# Patient Record
Sex: Female | Born: 1940 | Race: White | Hispanic: No | Marital: Married | Smoking: Never smoker
Health system: Southern US, Community
[De-identification: ages and names within clinical notes are randomized; demographics above are authoritative.]

## PROBLEM LIST (undated history)

## (undated) DIAGNOSIS — B029 Zoster without complications: Secondary | ICD-10-CM

## (undated) DIAGNOSIS — N819 Female genital prolapse, unspecified: Secondary | ICD-10-CM

## (undated) DIAGNOSIS — B2799 Infectious mononucleosis, unspecified with other complication: Secondary | ICD-10-CM

## (undated) DIAGNOSIS — K219 Gastro-esophageal reflux disease without esophagitis: Secondary | ICD-10-CM

## (undated) DIAGNOSIS — E559 Vitamin D deficiency, unspecified: Secondary | ICD-10-CM

## (undated) DIAGNOSIS — R32 Unspecified urinary incontinence: Secondary | ICD-10-CM

## (undated) DIAGNOSIS — K623 Rectal prolapse: Secondary | ICD-10-CM

## (undated) DIAGNOSIS — A809 Acute poliomyelitis, unspecified: Secondary | ICD-10-CM

## (undated) DIAGNOSIS — E039 Hypothyroidism, unspecified: Secondary | ICD-10-CM

## (undated) DIAGNOSIS — B178 Other specified acute viral hepatitis: Secondary | ICD-10-CM

## (undated) DIAGNOSIS — M40209 Unspecified kyphosis, site unspecified: Secondary | ICD-10-CM

## (undated) HISTORY — PX: ABDOMINAL HYSTERECTOMY: SHX81

## (undated) HISTORY — PX: BACK SURGERY: SHX140

---

## 2001-06-25 ENCOUNTER — Encounter: Payer: Self-pay | Admitting: Endocrinology

## 2001-06-25 ENCOUNTER — Encounter: Admission: RE | Admit: 2001-06-25 | Discharge: 2001-06-25 | Payer: Self-pay | Admitting: Endocrinology

## 2001-08-24 ENCOUNTER — Encounter: Payer: Self-pay | Admitting: Endocrinology

## 2001-08-24 ENCOUNTER — Encounter: Admission: RE | Admit: 2001-08-24 | Discharge: 2001-08-24 | Payer: Self-pay | Admitting: Endocrinology

## 2001-11-26 ENCOUNTER — Encounter: Admission: RE | Admit: 2001-11-26 | Discharge: 2001-11-26 | Payer: Self-pay | Admitting: Endocrinology

## 2001-11-26 ENCOUNTER — Encounter: Payer: Self-pay | Admitting: Endocrinology

## 2006-10-31 ENCOUNTER — Ambulatory Visit (HOSPITAL_COMMUNITY): Admission: RE | Admit: 2006-10-31 | Discharge: 2006-10-31 | Payer: Self-pay | Admitting: Neurosurgery

## 2016-02-02 ENCOUNTER — Emergency Department (HOSPITAL_BASED_OUTPATIENT_CLINIC_OR_DEPARTMENT_OTHER)
Admission: EM | Admit: 2016-02-02 | Discharge: 2016-02-02 | Disposition: A | Discharge status: Home / Self Care | Payer: Medicare Other | Attending: Emergency Medicine | Admitting: Emergency Medicine

## 2016-02-02 ENCOUNTER — Encounter (HOSPITAL_BASED_OUTPATIENT_CLINIC_OR_DEPARTMENT_OTHER): Payer: Self-pay

## 2016-02-02 DIAGNOSIS — K625 Hemorrhage of anus and rectum: Secondary | ICD-10-CM | POA: Diagnosis present

## 2016-02-02 DIAGNOSIS — Z79899 Other long term (current) drug therapy: Secondary | ICD-10-CM | POA: Insufficient documentation

## 2016-02-02 DIAGNOSIS — K649 Unspecified hemorrhoids: Secondary | ICD-10-CM | POA: Insufficient documentation

## 2016-02-02 DIAGNOSIS — E039 Hypothyroidism, unspecified: Secondary | ICD-10-CM | POA: Insufficient documentation

## 2016-02-02 DIAGNOSIS — K623 Rectal prolapse: Secondary | ICD-10-CM

## 2016-02-02 HISTORY — DX: Infectious mononucleosis, unspecified with other complication: B27.99

## 2016-02-02 HISTORY — DX: Rectal prolapse: K62.3

## 2016-02-02 HISTORY — DX: Other specified acute viral hepatitis: B17.8

## 2016-02-02 HISTORY — DX: Zoster without complications: B02.9

## 2016-02-02 HISTORY — DX: Unspecified kyphosis, site unspecified: M40.209

## 2016-02-02 HISTORY — DX: Gastro-esophageal reflux disease without esophagitis: K21.9

## 2016-02-02 HISTORY — DX: Hypothyroidism, unspecified: E03.9

## 2016-02-02 HISTORY — DX: Vitamin D deficiency, unspecified: E55.9

## 2016-02-02 HISTORY — DX: Unspecified urinary incontinence: R32

## 2016-02-02 HISTORY — DX: Female genital prolapse, unspecified: N81.9

## 2016-02-02 HISTORY — DX: Acute poliomyelitis, unspecified: A80.9

## 2016-02-02 MED ORDER — OXYCODONE HCL 5 MG PO TABS
15.0000 mg | ORAL_TABLET | Freq: Once | ORAL | Status: AC
  Administered 2016-02-02: 15 mg via ORAL
  Filled 2016-02-02: qty 3

## 2016-02-02 NOTE — Discharge Instructions (Signed)
Call a surgeon if needed.

## 2016-02-02 NOTE — ED Provider Notes (Signed)
[facility]   CSN: [id number] Arrival date & time: 02/02/16  1256     History   Chief Complaint Chief Complaint  Patient presents with  . Rectal Bleeding    HPI Wendy Espinoza is a 76 y.o. female.  76 yo F with a chief complaint of rectal prolapse. This is been an ongoing issue for this patient. She has never had trouble reducing it at home. Was unable to reduce it yesterday. Had some muco-bloody discharge. Over the course of the day she soaks through one pad. Denies near syncope or presyncopal feeling. Denies weakness. Complaining of chronic low back pain. Not changing in character or severity. Denies any other complaints at this time. Her prolapse reduced spontaneously this morning.   The history is provided by the patient and the spouse.  Rectal Bleeding  Quality:  Bright red and mucoid Amount:  Scant Duration:  1 day Timing:  Constant Chronicity:  New Context: hemorrhoids   Context comment:  Rectal prolapse Similar prior episodes: no   Relieved by:  Nothing Worsened by:  Nothing Ineffective treatments:  None tried Associated symptoms: no dizziness, no fever and no vomiting     Past Medical History:  Diagnosis Date  . Genital prolapse   . GERD (gastroesophageal reflux disease)   . Hypothyroid   . Kyphosis   . Mononucleosis, infectious, with hepatitis   . Polio   . Rectal prolapse   . Shingles   . Urinary incontinence   . Vitamin D deficiency     There are no active problems to display for this patient.   Past Surgical History:  Procedure Laterality Date  . ABDOMINAL HYSTERECTOMY    . BACK SURGERY      OB History    No data available       Home Medications    Prior to Admission medications   Medication Sig Start Date End Date Taking? Authorizing Provider  ALBUTEROL IN Inhale into the lungs.   Yes Historical Provider, MD  Carisoprodol (SOMA PO) Take by mouth.   Yes Historical Provider, MD  Cetirizine HCl (ZYRTEC PO)  Take by mouth.   Yes Historical Provider, MD  ClonazePAM (KLONOPIN PO) Take by mouth.   Yes Historical Provider, MD  Diphenoxylate-Atropine (LOMOTIL PO) Take by mouth.   Yes Historical Provider, MD  DULoxetine HCl (CYMBALTA PO) Take by mouth.   Yes Historical Provider, MD  Levothyroxine Sodium (SYNTHROID PO) Take by mouth.   Yes Historical Provider, MD  Modafinil (PROVIGIL PO) Take by mouth.   Yes Historical Provider, MD  Olopatadine HCl (PATADAY OP) Apply to eye.   Yes Historical Provider, MD  Omeprazole (PRILOSEC PO) Take by mouth.   Yes Historical Provider, MD  oxyCODONE (ROXICODONE) 15 MG immediate release tablet Take 15 mg by mouth every 4 (four) hours as needed for pain.   Yes Historical Provider, MD  Polyethyl Glycol-Propyl Glycol (SYSTANE OP) Apply to eye.   Yes Historical Provider, MD    Family History No family history on file.  Social History Social History  Substance Use Topics  . Smoking status: Never Smoker  . Smokeless tobacco: Never Used  . Alcohol use Not on file     Allergies   Gabapentin; Gluten meal; Lactase; Lyrica [pregabalin]; Nsaids; Soy allergy; Tagamet [cimetidine]; Tizanidine; Topamax [topiramate]; and Wheat bran   Review of Systems Review of Systems  Constitutional: Negative for chills and fever.  HENT: Negative for congestion and rhinorrhea.   Eyes: Negative for  redness and visual disturbance.  Respiratory: Negative for shortness of breath and wheezing.   Cardiovascular: Negative for chest pain and palpitations.  Gastrointestinal: Positive for blood in stool, hematochezia and rectal pain. Negative for nausea and vomiting.  Genitourinary: Negative for dysuria and urgency.  Musculoskeletal: Negative for arthralgias and myalgias.  Skin: Negative for pallor and wound.  Neurological: Negative for dizziness and headaches.     Physical Exam Updated Vital Signs BP 145/74 (BP Location: Right Arm)   Pulse 85   Temp 98.3 F (36.8 C) (Oral)   Resp 16    Ht 5\' 3"  (1.6 m)   Wt 115 lb (52.2 kg)   SpO2 100%   BMI 20.37 kg/m   Physical Exam  Constitutional: She is oriented to person, place, and time. She appears cachectic. No distress.  HENT:  Head: Normocephalic and atraumatic.  Eyes: EOM are normal. Pupils are equal, round, and reactive to light.  Neck: Normal range of motion. Neck supple.  Cardiovascular: Normal rate and regular rhythm.  Exam reveals no gallop and no friction rub.   No murmur heard. Pulmonary/Chest: Effort normal. She has no wheezes. She has no rales.  Abdominal: Soft. She exhibits no distension. There is no tenderness.  Genitourinary:  Genitourinary Comments: Hemorrhoids noted noted rectal prolapse  Musculoskeletal: She exhibits no edema or tenderness.  Neurological: She is alert and oriented to person, place, and time.  Skin: Skin is warm and dry. She is not diaphoretic.  Psychiatric: She has a normal mood and affect. Her behavior is normal.  Nursing note and vitals reviewed.    ED Treatments / Results  Labs (all labs ordered are listed, but only abnormal results are displayed) Labs Reviewed - No data to display  EKG  EKG Interpretation None       Radiology No results found.  Procedures Procedures (including critical care time)  Medications Ordered in ED Medications  oxyCODONE (Oxy IR/ROXICODONE) immediate release tablet 15 mg (not administered)     Initial Impression / Assessment and Plan / ED Course  I have reviewed the triage vital signs and the nursing notes.  Pertinent labs & imaging results that were available during my care of the patient were reviewed by me and considered in my medical decision making (see chart for details).  Clinical Course     76 yo F With a chief complaint of rectal prolapse. Family does not feel that she is anemic. For labs at this time. This reduces spontaneously prior to my evaluation. Given surgery follow-up. Discharge home.  3:19 PM:  I have discussed  the diagnosis/risks/treatment options with the patient and family and believe the pt to be eligible for discharge home to follow-up with PCP, Gen surgery. We also discussed returning to the ED immediately if new or worsening sx occur. We discussed the sx which are most concerning (e.g., sudden worsening pain, fever, inability to tolerate by mouth) that necessitate immediate return. Medications administered to the patient during their visit and any new prescriptions provided to the patient are listed below.  Medications given during this visit Medications  oxyCODONE (Oxy IR/ROXICODONE) immediate release tablet 15 mg (not administered)     The patient appears reasonably screen and/or stabilized for discharge and I doubt any other medical condition or other [facility] requiring further screening, evaluation, or treatment in the ED at this time prior to discharge.    Final Clinical Impressions(s) / ED Diagnoses   Final diagnoses:  Rectal prolapse  Rectal bleeding  Hemorrhoids, unspecified  hemorrhoid type    New Prescriptions New Prescriptions   No medications on file     Melene Plan, DO 02/02/16 1519

## 2016-02-02 NOTE — ED Triage Notes (Signed)
C/o "bloody discharge" from prolapsed rectum x 2-3 day-presents to triage in w/c with family-NAD

## 2016-02-11 ENCOUNTER — Emergency Department (HOSPITAL_COMMUNITY): Payer: Medicare Other | Admitting: Anesthesiology

## 2016-02-11 ENCOUNTER — Encounter (HOSPITAL_COMMUNITY): Payer: Self-pay

## 2016-02-11 ENCOUNTER — Encounter (HOSPITAL_COMMUNITY): Admission: EM | Disposition: A | Discharge status: Home / Self Care | Payer: Self-pay | Source: Home / Self Care

## 2016-02-11 ENCOUNTER — Inpatient Hospital Stay (HOSPITAL_COMMUNITY)
Admission: EM | Admit: 2016-02-11 | Discharge: 2016-02-23 | DRG: 329 | Disposition: A | Discharge status: Home / Self Care | Payer: Medicare Other | Attending: Family Medicine | Admitting: Family Medicine

## 2016-02-11 DIAGNOSIS — Z90710 Acquired absence of both cervix and uterus: Secondary | ICD-10-CM | POA: Diagnosis not present

## 2016-02-11 DIAGNOSIS — Z888 Allergy status to other drugs, medicaments and biological substances status: Secondary | ICD-10-CM

## 2016-02-11 DIAGNOSIS — Y92009 Unspecified place in unspecified non-institutional (private) residence as the place of occurrence of the external cause: Secondary | ICD-10-CM | POA: Diagnosis not present

## 2016-02-11 DIAGNOSIS — E43 Unspecified severe protein-calorie malnutrition: Secondary | ICD-10-CM | POA: Diagnosis present

## 2016-02-11 DIAGNOSIS — B370 Candidal stomatitis: Secondary | ICD-10-CM | POA: Diagnosis present

## 2016-02-11 DIAGNOSIS — K625 Hemorrhage of anus and rectum: Secondary | ICD-10-CM | POA: Diagnosis present

## 2016-02-11 DIAGNOSIS — Z886 Allergy status to analgesic agent status: Secondary | ICD-10-CM

## 2016-02-11 DIAGNOSIS — Z91018 Allergy to other foods: Secondary | ICD-10-CM

## 2016-02-11 DIAGNOSIS — J189 Pneumonia, unspecified organism: Secondary | ICD-10-CM | POA: Diagnosis not present

## 2016-02-11 DIAGNOSIS — X500XXA Overexertion from strenuous movement or load, initial encounter: Secondary | ICD-10-CM

## 2016-02-11 DIAGNOSIS — Z8612 Personal history of poliomyelitis: Secondary | ICD-10-CM

## 2016-02-11 DIAGNOSIS — E871 Hypo-osmolality and hyponatremia: Secondary | ICD-10-CM | POA: Diagnosis not present

## 2016-02-11 DIAGNOSIS — Z79891 Long term (current) use of opiate analgesic: Secondary | ICD-10-CM | POA: Diagnosis not present

## 2016-02-11 DIAGNOSIS — G894 Chronic pain syndrome: Secondary | ICD-10-CM | POA: Diagnosis present

## 2016-02-11 DIAGNOSIS — E039 Hypothyroidism, unspecified: Secondary | ICD-10-CM | POA: Diagnosis present

## 2016-02-11 DIAGNOSIS — Z9889 Other specified postprocedural states: Secondary | ICD-10-CM

## 2016-02-11 DIAGNOSIS — G2581 Restless legs syndrome: Secondary | ICD-10-CM | POA: Diagnosis present

## 2016-02-11 DIAGNOSIS — Z6820 Body mass index (BMI) 20.0-20.9, adult: Secondary | ICD-10-CM | POA: Diagnosis not present

## 2016-02-11 DIAGNOSIS — Q438 Other specified congenital malformations of intestine: Secondary | ICD-10-CM

## 2016-02-11 DIAGNOSIS — R0989 Other specified symptoms and signs involving the circulatory and respiratory systems: Secondary | ICD-10-CM

## 2016-02-11 DIAGNOSIS — K567 Ileus, unspecified: Secondary | ICD-10-CM | POA: Diagnosis not present

## 2016-02-11 DIAGNOSIS — M40209 Unspecified kyphosis, site unspecified: Secondary | ICD-10-CM | POA: Diagnosis present

## 2016-02-11 DIAGNOSIS — E876 Hypokalemia: Secondary | ICD-10-CM | POA: Diagnosis present

## 2016-02-11 DIAGNOSIS — Y95 Nosocomial condition: Secondary | ICD-10-CM | POA: Diagnosis present

## 2016-02-11 DIAGNOSIS — Y93F2 Activity, caregiving, lifting: Secondary | ICD-10-CM | POA: Diagnosis not present

## 2016-02-11 DIAGNOSIS — I10 Essential (primary) hypertension: Secondary | ICD-10-CM | POA: Diagnosis present

## 2016-02-11 DIAGNOSIS — S3663XA Laceration of rectum, initial encounter: Secondary | ICD-10-CM | POA: Diagnosis present

## 2016-02-11 DIAGNOSIS — R32 Unspecified urinary incontinence: Secondary | ICD-10-CM | POA: Diagnosis present

## 2016-02-11 DIAGNOSIS — IMO0002 Reserved for concepts with insufficient information to code with codable children: Secondary | ICD-10-CM | POA: Diagnosis present

## 2016-02-11 DIAGNOSIS — Z79899 Other long term (current) drug therapy: Secondary | ICD-10-CM

## 2016-02-11 DIAGNOSIS — K219 Gastro-esophageal reflux disease without esophagitis: Secondary | ICD-10-CM | POA: Diagnosis present

## 2016-02-11 DIAGNOSIS — D62 Acute posthemorrhagic anemia: Secondary | ICD-10-CM | POA: Diagnosis not present

## 2016-02-11 DIAGNOSIS — R262 Difficulty in walking, not elsewhere classified: Secondary | ICD-10-CM

## 2016-02-11 DIAGNOSIS — K439 Ventral hernia without obstruction or gangrene: Secondary | ICD-10-CM | POA: Diagnosis not present

## 2016-02-11 DIAGNOSIS — D638 Anemia in other chronic diseases classified elsewhere: Secondary | ICD-10-CM | POA: Diagnosis present

## 2016-02-11 DIAGNOSIS — R509 Fever, unspecified: Secondary | ICD-10-CM

## 2016-02-11 HISTORY — PX: COLOSTOMY: SHX63

## 2016-02-11 HISTORY — PX: COLON RESECTION SIGMOID: SHX6737

## 2016-02-11 HISTORY — PX: LAPAROTOMY: SHX154

## 2016-02-11 LAB — POCT I-STAT EG7
BICARBONATE: 26.2 mmol/L (ref 20.0–28.0)
Calcium, Ion: 1.19 mmol/L (ref 1.15–1.40)
HEMATOCRIT: 26 % — AB (ref 36.0–46.0)
Hemoglobin: 8.8 g/dL — ABNORMAL LOW (ref 12.0–15.0)
O2 SAT: 73 %
PCO2 VEN: 47.6 mmHg (ref 44.0–60.0)
PO2 VEN: 41 mmHg (ref 32.0–45.0)
Potassium: 3.6 mmol/L (ref 3.5–5.1)
Sodium: 139 mmol/L (ref 135–145)
TCO2: 28 mmol/L (ref 0–100)
pH, Ven: 7.349 (ref 7.250–7.430)

## 2016-02-11 LAB — URINALYSIS, ROUTINE W REFLEX MICROSCOPIC
Bilirubin Urine: NEGATIVE
GLUCOSE, UA: 50 mg/dL — AB
KETONES UR: NEGATIVE mg/dL
Leukocytes, UA: NEGATIVE
NITRITE: NEGATIVE
PROTEIN: 100 mg/dL — AB
Specific Gravity, Urine: 1.025 (ref 1.005–1.030)
pH: 5 (ref 5.0–8.0)

## 2016-02-11 LAB — CBC WITH DIFFERENTIAL/PLATELET
BASOS ABS: 0.1 10*3/uL (ref 0.0–0.1)
Basophils Relative: 1 %
EOS ABS: 0.1 10*3/uL (ref 0.0–0.7)
Eosinophils Relative: 1 %
HCT: 24.1 % — ABNORMAL LOW (ref 36.0–46.0)
HEMOGLOBIN: 6.7 g/dL — AB (ref 12.0–15.0)
LYMPHS ABS: 1.2 10*3/uL (ref 0.7–4.0)
Lymphocytes Relative: 21 %
MCH: 17.3 pg — AB (ref 26.0–34.0)
MCHC: 27.8 g/dL — ABNORMAL LOW (ref 30.0–36.0)
MCV: 62.1 fL — AB (ref 78.0–100.0)
Monocytes Absolute: 0.4 10*3/uL (ref 0.1–1.0)
Monocytes Relative: 7 %
NEUTROS ABS: 3.7 10*3/uL (ref 1.7–7.7)
Neutrophils Relative %: 70 %
PLATELETS: 439 10*3/uL — AB (ref 150–400)
RBC: 3.88 MIL/uL (ref 3.87–5.11)
RDW: 20.3 % — ABNORMAL HIGH (ref 11.5–15.5)
WBC: 5.5 10*3/uL (ref 4.0–10.5)

## 2016-02-11 LAB — BASIC METABOLIC PANEL
Anion gap: 10 (ref 5–15)
BUN: 28 mg/dL — ABNORMAL HIGH (ref 6–20)
CHLORIDE: 103 mmol/L (ref 101–111)
CO2: 25 mmol/L (ref 22–32)
CREATININE: 0.54 mg/dL (ref 0.44–1.00)
Calcium: 8.8 mg/dL — ABNORMAL LOW (ref 8.9–10.3)
GFR calc Af Amer: >60 mL/min (ref >60)
GFR calc non Af Amer: >60 mL/min (ref >60)
GLUCOSE: 153 mg/dL — AB (ref 65–99)
Potassium: 3.5 mmol/L (ref 3.5–5.1)
SODIUM: 138 mmol/L (ref 135–145)

## 2016-02-11 LAB — BLOOD PRODUCT ORDER (VERBAL) VERIFICATION

## 2016-02-11 LAB — PROTIME-INR
INR: 0.99
PROTHROMBIN TIME: 13.1 s (ref 11.4–15.2)

## 2016-02-11 LAB — PREPARE RBC (CROSSMATCH)

## 2016-02-11 LAB — ABO/RH: ABO/RH(D): O POS

## 2016-02-11 SURGERY — CREATION, COLOSTOMY
Anesthesia: General | Site: Abdomen

## 2016-02-11 MED ORDER — HYDROMORPHONE HCL 2 MG/ML IJ SOLN
1.0000 mg | Freq: Once | INTRAMUSCULAR | Status: AC
  Administered 2016-02-11: 1 mg via INTRAVENOUS

## 2016-02-11 MED ORDER — SODIUM CHLORIDE 0.9 % IJ SOLN
INTRAMUSCULAR | Status: AC
  Filled 2016-02-11: qty 10

## 2016-02-11 MED ORDER — ONDANSETRON HCL 4 MG/2ML IJ SOLN
4.0000 mg | Freq: Once | INTRAMUSCULAR | Status: AC
  Administered 2016-02-11: 4 mg via INTRAVENOUS
  Filled 2016-02-11: qty 2

## 2016-02-11 MED ORDER — KETAMINE HCL-SODIUM CHLORIDE 100-0.9 MG/10ML-% IV SOSY
PREFILLED_SYRINGE | INTRAVENOUS | Status: AC
  Filled 2016-02-11: qty 10

## 2016-02-11 MED ORDER — FENTANYL 50 MCG/HR TD PT72
50.0000 ug | MEDICATED_PATCH | TRANSDERMAL | Status: DC
  Administered 2016-02-11 – 2016-02-23 (×5): 50 ug via TRANSDERMAL
  Filled 2016-02-11 (×5): qty 1

## 2016-02-11 MED ORDER — SODIUM CHLORIDE 0.9 % IV BOLUS (SEPSIS)
1000.0000 mL | Freq: Once | INTRAVENOUS | Status: AC
  Administered 2016-02-11: 1000 mL via INTRAVENOUS

## 2016-02-11 MED ORDER — ROCURONIUM BROMIDE 50 MG/5ML IV SOSY
PREFILLED_SYRINGE | INTRAVENOUS | Status: AC
  Filled 2016-02-11: qty 5

## 2016-02-11 MED ORDER — ROCURONIUM BROMIDE 100 MG/10ML IV SOLN
INTRAVENOUS | Status: DC | PRN
  Administered 2016-02-11: 40 mg via INTRAVENOUS

## 2016-02-11 MED ORDER — DEXAMETHASONE SODIUM PHOSPHATE 10 MG/ML IJ SOLN
INTRAMUSCULAR | Status: DC | PRN
  Administered 2016-02-11: 10 mg via INTRAVENOUS

## 2016-02-11 MED ORDER — SUFENTANIL CITRATE 50 MCG/ML IV SOLN
INTRAVENOUS | Status: DC | PRN
  Administered 2016-02-11 (×8): 5 ug via INTRAVENOUS

## 2016-02-11 MED ORDER — DEXTROSE 5 % IV SOLN
1.0000 g | Freq: Once | INTRAVENOUS | Status: AC
  Administered 2016-02-11: 1 g via INTRAVENOUS
  Filled 2016-02-11: qty 1

## 2016-02-11 MED ORDER — SUCCINYLCHOLINE CHLORIDE 20 MG/ML IJ SOLN
INTRAMUSCULAR | Status: DC | PRN
  Administered 2016-02-11: 100 mg via INTRAVENOUS

## 2016-02-11 MED ORDER — LIDOCAINE 2% (20 MG/ML) 5 ML SYRINGE
INTRAMUSCULAR | Status: AC
  Filled 2016-02-11: qty 5

## 2016-02-11 MED ORDER — METHOCARBAMOL 1000 MG/10ML IJ SOLN
500.0000 mg | Freq: Four times a day (QID) | INTRAVENOUS | Status: DC | PRN
  Administered 2016-02-11 (×2): 500 mg via INTRAVENOUS
  Filled 2016-02-11 (×4): qty 5

## 2016-02-11 MED ORDER — SUGAMMADEX SODIUM 200 MG/2ML IV SOLN
INTRAVENOUS | Status: DC | PRN
  Administered 2016-02-11: 200 mg via INTRAVENOUS

## 2016-02-11 MED ORDER — MORPHINE SULFATE (PF) 2 MG/ML IV SOLN
2.0000 mg | INTRAVENOUS | Status: DC | PRN
  Administered 2016-02-11: 2 mg via INTRAVENOUS
  Administered 2016-02-11 (×5): 4 mg via INTRAVENOUS
  Administered 2016-02-11 – 2016-02-12 (×5): 2 mg via INTRAVENOUS
  Filled 2016-02-11 (×2): qty 1
  Filled 2016-02-11: qty 2
  Filled 2016-02-11: qty 1
  Filled 2016-02-11: qty 2
  Filled 2016-02-11: qty 1
  Filled 2016-02-11 (×3): qty 2
  Filled 2016-02-11 (×2): qty 1

## 2016-02-11 MED ORDER — ENOXAPARIN SODIUM 40 MG/0.4ML ~~LOC~~ SOLN
40.0000 mg | SUBCUTANEOUS | Status: DC
  Administered 2016-02-11 – 2016-02-22 (×12): 40 mg via SUBCUTANEOUS
  Filled 2016-02-11 (×11): qty 0.4

## 2016-02-11 MED ORDER — LABETALOL HCL 5 MG/ML IV SOLN
INTRAVENOUS | Status: AC
  Filled 2016-02-11: qty 4

## 2016-02-11 MED ORDER — DEXTROSE 5 % IV SOLN
1.0000 g | Freq: Once | INTRAVENOUS | Status: DC
  Filled 2016-02-11 (×2): qty 1

## 2016-02-11 MED ORDER — MORPHINE SULFATE (PF) 2 MG/ML IV SOLN
2.0000 mg | INTRAVENOUS | Status: DC | PRN
  Administered 2016-02-11: 4 mg via INTRAVENOUS
  Filled 2016-02-11: qty 2

## 2016-02-11 MED ORDER — MEPERIDINE HCL 25 MG/ML IJ SOLN: 6.2500 mg | INTRAMUSCULAR | Status: DC | PRN

## 2016-02-11 MED ORDER — HYDROMORPHONE HCL 2 MG/ML IJ SOLN
INTRAMUSCULAR | Status: AC
  Administered 2016-02-11: 1 mg
  Filled 2016-02-11: qty 1

## 2016-02-11 MED ORDER — SUFENTANIL CITRATE 50 MCG/ML IV SOLN
INTRAVENOUS | Status: AC
  Filled 2016-02-11: qty 1

## 2016-02-11 MED ORDER — ONDANSETRON HCL 4 MG/2ML IJ SOLN
INTRAMUSCULAR | Status: DC | PRN
  Administered 2016-02-11 (×2): 4 mg via INTRAVENOUS

## 2016-02-11 MED ORDER — LIDOCAINE HCL (CARDIAC) 20 MG/ML IV SOLN
INTRAVENOUS | Status: DC | PRN
  Administered 2016-02-11: 100 mg via INTRATRACHEAL

## 2016-02-11 MED ORDER — LACTATED RINGERS IV SOLN
INTRAVENOUS | Status: DC | PRN
  Administered 2016-02-11 (×2): via INTRAVENOUS

## 2016-02-11 MED ORDER — LABETALOL HCL 5 MG/ML IV SOLN
5.0000 mg | INTRAVENOUS | Status: DC | PRN
Start: 2016-02-11 — End: 2016-02-11
  Administered 2016-02-11: 5 mg via INTRAVENOUS

## 2016-02-11 MED ORDER — PROPOFOL 10 MG/ML IV BOLUS
INTRAVENOUS | Status: DC | PRN
  Administered 2016-02-11: 130 mg via INTRAVENOUS

## 2016-02-11 MED ORDER — 0.9 % SODIUM CHLORIDE (POUR BTL) OPTIME
TOPICAL | Status: DC | PRN
  Administered 2016-02-11: 4000 mL

## 2016-02-11 MED ORDER — ACETAMINOPHEN 325 MG PO TABS
650.0000 mg | ORAL_TABLET | Freq: Four times a day (QID) | ORAL | Status: DC
  Administered 2016-02-12 (×2): 650 mg via ORAL
  Filled 2016-02-11 (×3): qty 2

## 2016-02-11 MED ORDER — ONDANSETRON HCL 4 MG/2ML IJ SOLN
INTRAMUSCULAR | Status: AC
  Filled 2016-02-11: qty 2

## 2016-02-11 MED ORDER — HYDROMORPHONE HCL 2 MG/ML IJ SOLN
INTRAMUSCULAR | Status: AC
  Filled 2016-02-11: qty 1

## 2016-02-11 MED ORDER — KETAMINE HCL 10 MG/ML IJ SOLN
INTRAMUSCULAR | Status: DC | PRN
Start: 2016-02-11 — End: 2016-02-11
  Administered 2016-02-11: 10 mg via INTRAVENOUS
  Administered 2016-02-11: 40 mg via INTRAVENOUS

## 2016-02-11 MED ORDER — MIDAZOLAM HCL 2 MG/2ML IJ SOLN
INTRAMUSCULAR | Status: DC | PRN
  Administered 2016-02-11: 1 mg via INTRAVENOUS

## 2016-02-11 MED ORDER — HYDROMORPHONE HCL 1 MG/ML IJ SOLN
0.2500 mg | INTRAMUSCULAR | Status: DC | PRN
  Administered 2016-02-11 (×4): 0.5 mg via INTRAVENOUS

## 2016-02-11 MED ORDER — PROPOFOL 10 MG/ML IV BOLUS
INTRAVENOUS | Status: AC
  Filled 2016-02-11: qty 20

## 2016-02-11 MED ORDER — PANTOPRAZOLE SODIUM 40 MG IV SOLR
40.0000 mg | Freq: Every day | INTRAVENOUS | Status: DC
  Administered 2016-02-11 – 2016-02-17 (×7): 40 mg via INTRAVENOUS
  Filled 2016-02-11 (×7): qty 40

## 2016-02-11 MED ORDER — PROMETHAZINE HCL 25 MG/ML IJ SOLN: 6.2500 mg | INTRAMUSCULAR | Status: DC | PRN

## 2016-02-11 MED ORDER — SUCCINYLCHOLINE CHLORIDE 200 MG/10ML IV SOSY
PREFILLED_SYRINGE | INTRAVENOUS | Status: AC
  Filled 2016-02-11: qty 10

## 2016-02-11 MED ORDER — KCL IN DEXTROSE-NACL 20-5-0.45 MEQ/L-%-% IV SOLN
INTRAVENOUS | Status: DC
  Administered 2016-02-11 – 2016-02-17 (×13): via INTRAVENOUS
  Filled 2016-02-11 (×14): qty 1000

## 2016-02-11 MED ORDER — MIDAZOLAM HCL 2 MG/2ML IJ SOLN
INTRAMUSCULAR | Status: AC
  Filled 2016-02-11: qty 2

## 2016-02-11 MED ORDER — MORPHINE SULFATE (PF) 4 MG/ML IV SOLN
4.0000 mg | Freq: Once | INTRAVENOUS | Status: AC
  Administered 2016-02-11: 4 mg via INTRAVENOUS
  Filled 2016-02-11: qty 1

## 2016-02-11 MED ORDER — WHITE PETROLATUM GEL
Status: DC | PRN
  Administered 2016-02-12: 08:00:00 via TOPICAL
  Filled 2016-02-11 (×2): qty 1

## 2016-02-11 SURGICAL SUPPLY — 50 items
BLADE SURG ROTATE 9660 (MISCELLANEOUS) IMPLANT
BRR ADH 5X3 SEPRAFILM 6 SHT (MISCELLANEOUS)
CANISTER SUCTION 2500CC (MISCELLANEOUS) ×2 IMPLANT
CHLORAPREP W/TINT 26ML (MISCELLANEOUS) ×2 IMPLANT
COVER SURGICAL LIGHT HANDLE (MISCELLANEOUS) ×2 IMPLANT
DRAPE LAPAROSCOPIC ABDOMINAL (DRAPES) ×2 IMPLANT
DRAPE WARM FLUID 44X44 (DRAPE) ×2 IMPLANT
DRSG OPSITE POSTOP 4X10 (GAUZE/BANDAGES/DRESSINGS) IMPLANT
DRSG OPSITE POSTOP 4X8 (GAUZE/BANDAGES/DRESSINGS) IMPLANT
DRSG PAD ABDOMINAL 8X10 ST (GAUZE/BANDAGES/DRESSINGS) ×2 IMPLANT
ELECT BLADE 6.5 EXT (BLADE) ×2 IMPLANT
ELECT CAUTERY BLADE 6.4 (BLADE) ×2 IMPLANT
ELECT REM PT RETURN 9FT ADLT (ELECTROSURGICAL) ×2
ELECTRODE REM PT RTRN 9FT ADLT (ELECTROSURGICAL) ×1 IMPLANT
GAUZE SPONGE 4X4 12PLY STRL (GAUZE/BANDAGES/DRESSINGS) ×2 IMPLANT
GLOVE BIOGEL PI IND STRL 8 (GLOVE) ×3 IMPLANT
GLOVE BIOGEL PI INDICATOR 8 (GLOVE) ×3
GLOVE ECLIPSE 7.5 STRL STRAW (GLOVE) ×2 IMPLANT
GLOVE INDICATOR 7.5 STRL GRN (GLOVE) ×4 IMPLANT
GLOVE SURG SS PI 7.5 STRL IVOR (GLOVE) ×2 IMPLANT
GOWN STRL REUS W/ TWL LRG LVL3 (GOWN DISPOSABLE) ×2 IMPLANT
GOWN STRL REUS W/TWL LRG LVL3 (GOWN DISPOSABLE) ×4
KIT BASIN OR (CUSTOM PROCEDURE TRAY) ×2 IMPLANT
KIT COLOSTOMY ILEOSTOMY 4 (WOUND CARE) ×2 IMPLANT
KIT ROOM TURNOVER OR (KITS) ×2 IMPLANT
LEGGING LITHOTOMY PAIR STRL (DRAPES) ×2 IMPLANT
LIGASURE IMPACT 36 18CM CVD LR (INSTRUMENTS) ×2 IMPLANT
NS IRRIG 1000ML POUR BTL (IV SOLUTION) ×8 IMPLANT
PACK GENERAL/GYN (CUSTOM PROCEDURE TRAY) ×2 IMPLANT
PAD ARMBOARD 7.5X6 YLW CONV (MISCELLANEOUS) ×2 IMPLANT
RELOAD PROXIMATE 75MM BLUE (ENDOMECHANICALS) ×2 IMPLANT
SEPRAFILM PROCEDURAL PACK 3X5 (MISCELLANEOUS) IMPLANT
SOLUTION BETADINE 4OZ (MISCELLANEOUS) ×4 IMPLANT
SPECIMEN JAR LARGE (MISCELLANEOUS) ×2 IMPLANT
SPONGE LAP 18X18 X RAY DECT (DISPOSABLE) IMPLANT
STAPLER CUT CVD 40MM GREEN (STAPLE) ×2 IMPLANT
STAPLER PROXIMATE 75MM BLUE (STAPLE) ×2 IMPLANT
STAPLER VISISTAT 35W (STAPLE) ×2 IMPLANT
SUCTION POOLE TIP (SUCTIONS) ×2 IMPLANT
SUT NOVA 1 T20/GS 25DT (SUTURE) IMPLANT
SUT PDS AB 1 TP1 96 (SUTURE) ×4 IMPLANT
SUT PDS II 0 TP-1 LOOPED 60 (SUTURE) ×4 IMPLANT
SUT SILK 2 0 SH CR/8 (SUTURE) ×2 IMPLANT
SUT SILK 2 0 TIES 10X30 (SUTURE) ×2 IMPLANT
SUT SILK 3 0 SH CR/8 (SUTURE) ×2 IMPLANT
SUT SILK 3 0 TIES 10X30 (SUTURE) ×2 IMPLANT
SUT VIC AB 3-0 SH 8-18 (SUTURE) ×2 IMPLANT
TOWEL OR 17X26 10 PK STRL BLUE (TOWEL DISPOSABLE) ×2 IMPLANT
TRAY FOLEY CATH 16FRSI W/METER (SET/KITS/TRAYS/PACK) ×2 IMPLANT
YANKAUER SUCT BULB TIP NO VENT (SUCTIONS) IMPLANT

## 2016-02-11 NOTE — Consult Note (Addendum)
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 Stomal assessment/size: Stoma dark red and appears to be flush with skin level when visualized through the pouch. Output: Scant amt brown liquid in pouch, no stool or flatus  Ostomy pouching: 1pc.  Education provided: Educational materials left at bedside and supplies ordered to the room.  Briefly discussed pouching routines.  Will begin pouch change demonstration tomorrow. Pt asked appropriate questions. Enrolled [facility] program: No Cammie Mcgeeawn Aniaya Bacha MSN, RN, [place], [place], [place] [phone]

## 2016-02-11 NOTE — ED Provider Notes (Signed)
[facility] Provider Note   CSN: [id number] Arrival date & time: 02/11/16  0441     History   Chief Complaint Chief Complaint  Patient presents with  . Rectal Bleeding    HPI Wendy Espinoza is a 76 y.o. female.  History of multiple rectal prolapses was picking up her 200 lb. Husband when she felt a sharp tear in her rectum with subsequent bleeding. Called EMS and came here. Hypertensive en route. 200 mcg fentanyl given en route. No h/o same. No modifying factors. No associated symptoms.     Rectal Bleeding    Past Medical History:  Diagnosis Date  . Genital prolapse   . GERD (gastroesophageal reflux disease)   . Hypothyroid   . Kyphosis   . Mononucleosis, infectious, with hepatitis   . Polio   . Rectal prolapse   . Shingles   . Urinary incontinence   . Vitamin D deficiency     There are no active problems to display for this patient.   Past Surgical History:  Procedure Laterality Date  . ABDOMINAL HYSTERECTOMY    . BACK SURGERY      OB History    No data available       Home Medications    Prior to Admission medications   Medication Sig Start Date End Date Taking? Authorizing Provider  ALBUTEROL IN Inhale into the lungs.    Historical Provider, MD  Carisoprodol (SOMA PO) Take by mouth.    Historical Provider, MD  Cetirizine HCl (ZYRTEC PO) Take by mouth.    Historical Provider, MD  ClonazePAM (KLONOPIN PO) Take by mouth.    Historical Provider, MD  Diphenoxylate-Atropine (LOMOTIL PO) Take by mouth.    Historical Provider, MD  DULoxetine HCl (CYMBALTA PO) Take by mouth.    Historical Provider, MD  Levothyroxine Sodium (SYNTHROID PO) Take by mouth.    Historical Provider, MD  Modafinil (PROVIGIL PO) Take by mouth.    Historical Provider, MD  Olopatadine HCl (PATADAY OP) Apply to eye.    Historical Provider, MD  Omeprazole (PRILOSEC PO) Take by mouth.    Historical Provider, MD  oxyCODONE (ROXICODONE) 15 MG immediate release tablet Take 15 mg  by mouth every 4 (four) hours as needed for pain.    Historical Provider, MD  Polyethyl Glycol-Propyl Glycol (SYSTANE OP) Apply to eye.    Historical Provider, MD    Family History History reviewed. No pertinent family history.  Social History Social History  Substance Use Topics  . Smoking status: Never Smoker  . Smokeless tobacco: Never Used  . Alcohol use Not on file     Allergies   Gabapentin; Gluten meal; Lactase; Lyrica [pregabalin]; Nsaids; Soy allergy; Tagamet [cimetidine]; Tizanidine; Topamax [topiramate]; and Wheat bran   Review of Systems Review of Systems  Gastrointestinal: Positive for hematochezia.  All other systems reviewed and are negative.    Physical Exam Updated Vital Signs BP 192/86   Pulse 90   Temp 97.8 F (36.6 C) (Oral)   Resp 14   Ht 5' 2.5" (1.588 m)   Wt 115 lb (52.2 kg)   SpO2 100%   BMI 20.70 kg/m   Physical Exam  Constitutional: She is oriented to person, place, and time. She appears well-developed and well-nourished. She appears distressed.  HENT:  Head: Normocephalic and atraumatic.  Eyes: Conjunctivae and EOM are normal.  Neck: Normal range of motion.  Cardiovascular: Normal rate and regular rhythm.   Pulmonary/Chest: No stridor. No respiratory distress.  Abdominal:  Please see picture below for e/o small bowel evisceration  Musculoskeletal: Normal range of motion. She exhibits no edema or deformity.  Neurological: She is alert and oriented to person, place, and time.  Nursing note and vitals reviewed.        ED Treatments / Results  Labs (all labs ordered are listed, but only abnormal results are displayed) Labs Reviewed  CBC WITH DIFFERENTIAL/PLATELET - Abnormal; Notable for the following:       Result Value   Hemoglobin 6.7 (*)    HCT 24.1 (*)    MCV 62.1 (*)    MCH 17.3 (*)    MCHC 27.8 (*)    RDW 20.3 (*)    All other components within normal limits  BASIC METABOLIC PANEL - Abnormal; Notable for the  following:    Glucose, Bld 153 (*)    BUN 28 (*)    Calcium 8.8 (*)    All other components within normal limits  PROTIME-INR  TYPE AND SCREEN  ABO/RH    EKG  EKG Interpretation None       Radiology No results found.  Procedures Procedures (including critical care time)  Medications Ordered in ED Medications  morphine 4 MG/ML injection 4 mg (4 mg Intravenous Given 02/11/16 0506)  ondansetron (ZOFRAN) injection 4 mg (4 mg Intravenous Given 02/11/16 0506)  sodium chloride 0.9 % bolus 1,000 mL (1,000 mLs Intravenous New Bag/Given 02/11/16 0506)  HYDROmorphone (DILAUDID) 2 MG/ML injection (1 mg  Given 02/11/16 0506)  cefoTEtan (CEFOTAN) 1 g in dextrose 5 % 50 mL IVPB (1 g Intravenous New Bag/Given 02/11/16 0555)  HYDROmorphone (DILAUDID) injection 1 mg (1 mg Intravenous Given 02/11/16 0513)     Initial Impression / Assessment and Plan / ED Course  I have reviewed the triage vital signs and the nursing notes.  Pertinent labs & imaging results that were available during my care of the patient were reviewed by me and considered in my medical decision making (see chart for details).     Patient with some type of small bowel visceration. Dr. Lindie SpruceWyatt paged immediately and was here quickly to take to OR. Pain meds administered.   Final Clinical Impressions(s) / ED Diagnoses   Final diagnoses:  None    New Prescriptions Current Discharge Medication List       Marily MemosJason Calene Paradiso, MD 02/11/16 [phone]

## 2016-02-11 NOTE — Anesthesia Postprocedure Evaluation (Signed)
Anesthesia Post Note  Patient: Wendy Espinoza  Procedure(s) Performed: Procedure(s) (LRB): EXPLORATORY LAPAROTOMY , REDUCTION OF EVISERATION ,REPAIR PELVIC FLOOR (N/A) COLON RESECTION SIGMOID (N/A) COLOSTOMY (N/A)  Patient location during evaluation: PACU Anesthesia Type: General Level of consciousness: awake, awake and alert and oriented Pain management: pain level controlled Vital Signs Assessment: post-procedure vital signs reviewed and stable Respiratory status: spontaneous breathing, nonlabored ventilation and respiratory function stable Anesthetic complications: no       Last Vitals:  Vitals:   02/11/16 0853 02/11/16 1140  BP: 135/65 (!) 167/68  Pulse: 71 83  Resp: 16   Temp: 36.9 C 37 C    Last Pain:  Vitals:   02/11/16 1243  TempSrc:   PainSc: 8                  Quantia Grullon COKER

## 2016-02-11 NOTE — ED Notes (Signed)
Belongings locked up in security  1 silver colored necklace with 3 charms 1-gold colored necklace with gold colored cross 4- gold colored rings 1- brown colored rope necklace with 2 silver colored charms. Ticket #[id number] Receipt #[id number]  Clothing all sent with patient to short stay.

## 2016-02-11 NOTE — Anesthesia Preprocedure Evaluation (Addendum)
Anesthesia Evaluation  Patient identified by MRN, date of birth, ID band Patient awake    Reviewed: Allergy & Precautions, NPO status , Patient's Chart, lab work & pertinent test results  Airway Mallampati: II  TM Distance: >3 FB Neck ROM: Full    Dental no notable dental hx.    Pulmonary neg pulmonary ROS,    Pulmonary exam normal breath sounds clear to auscultation       Cardiovascular negative cardio ROS Normal cardiovascular exam Rhythm:Regular Rate:Normal     Neuro/Psych negative neurological ROS  negative psych ROS   GI/Hepatic negative GI ROS, GERD  ,  Endo/Other  Hypothyroidism   Renal/GU negative Renal ROS     Musculoskeletal negative musculoskeletal ROS (+)   Abdominal   Peds  Hematology negative hematology ROS (+)   Anesthesia Other Findings   Reproductive/Obstetrics negative OB ROS                            Anesthesia Physical Anesthesia Plan  ASA: II and emergent  Anesthesia Plan: General   Post-op Pain Management:    Induction: Intravenous and Rapid sequence  Airway Management Planned: Oral ETT  Additional Equipment:   Intra-op Plan:   Post-operative Plan: Extubation in OR  Informed Consent: I have reviewed the patients History and Physical, chart, labs and discussed the procedure including the risks, benefits and alternatives for the proposed anesthesia with the patient or authorized representative who has indicated his/her understanding and acceptance.   Dental advisory given  Plan Discussed with: CRNA  Anesthesia Plan Comments:         Anesthesia Quick Evaluation

## 2016-02-11 NOTE — H&P (Signed)
Wendy MannerBarbara K Dornbush is an 76 y.o. female.   Chief Complaint: Abdominal pain and rectal pain HPI: Patient was lifting her husband who had fallen down, felt a tear in her lower abdomen and rectal area, developed severe abdominal pain, call EMS to get her.  Upon arrival she was found have have a significant amount of small bowel eviscerated through her perineum, along side her rectum.  Sever abdominal pain, but the small bowel appeared to be viable.  Past Medical History:  Diagnosis Date  . Genital prolapse   . GERD (gastroesophageal reflux disease)   . Hypothyroid   . Kyphosis   . Mononucleosis, infectious, with hepatitis   . Polio   . Rectal prolapse   . Shingles   . Urinary incontinence   . Vitamin D deficiency     Past Surgical History:  Procedure Laterality Date  . ABDOMINAL HYSTERECTOMY    . BACK SURGERY      History reviewed. No pertinent family history. Social History:  reports that she has never smoked. She has never used smokeless tobacco. She reports that she does not use drugs. Her alcohol history is not on file.  Allergies:  Allergies  Allergen Reactions  . Gabapentin   . Gluten Meal   . Lactase   . Lyrica [Pregabalin]   . Nsaids   . Soy Allergy   . Tagamet [Cimetidine]   . Tizanidine   . Topamax [Topiramate]   . Wheat Bran      (Not in a hospital admission)  No results found for this or any previous visit (from the past 48 hour(s)). No results found.  Review of Systems  Constitutional: Negative for chills and fever.  Gastrointestinal: Positive for abdominal pain. Negative for vomiting.  All other systems reviewed and are negative.   Blood pressure 192/86, pulse 90, temperature 97.8 F (36.6 C), temperature source Oral, resp. rate 14, height 5' 2.5" (1.588 m), weight 52.2 kg (115 lb), SpO2 100 %. Physical Exam  Vitals reviewed. Constitutional: She appears well-developed.  Small and frail  HENT:  Head: Normocephalic and atraumatic.  Eyes: Pupils are  equal, round, and reactive to light.  Cardiovascular: Normal rate, regular rhythm, normal heart sounds and intact distal pulses.   GI: Soft. Bowel sounds are decreased. There is generalized tenderness. There is no rigidity, no rebound and no guarding.  Genitourinary:           Assessment/Plan Eviscerated small bowel through perineum, not rectal prolapse.  Needs to be reduced in the OR.  Pelvic floor repair  Will be done in the lithotomy position  Jimmye NormanJAMES Zehra Rucci, MD 02/11/2016, 5:14 AM

## 2016-02-11 NOTE — Progress Notes (Signed)
pts bp remaining in the 190,s systolic dr Noreene Larssonjoslin notified here nat bedside orders obtained and carried out

## 2016-02-11 NOTE — Transfer of Care (Signed)
Immediate Anesthesia Transfer of Care Note  Patient: Wendy Espinoza  Procedure(s) Performed: Procedure(s): EXPLORATORY LAPAROTOMY , REDUCTION OF EVISERATION ,REPAIR PELVIC FLOOR (N/A) COLON RESECTION SIGMOID (N/A) COLOSTOMY (N/A)  Patient Location: PACU  Anesthesia Type:General  Level of Consciousness: patient cooperative and responds to stimulation  Airway & Oxygen Therapy: Patient Spontanous Breathing and Patient connected to face mask oxygen  Post-op Assessment: Report given to RN, Post -op Vital signs reviewed and stable and Patient moving all extremities X 4  Post vital signs: Reviewed and stable  Last Vitals:  Vitals:   02/11/16 0511 02/11/16 0737  BP:  (!) 214/92  Pulse:  94  Resp:  (!) 22  Temp: 36.6 C (!) 36 C    Last Pain:  Vitals:   02/11/16 0511  TempSrc: Oral  PainSc:          Complications: No apparent anesthesia complications

## 2016-02-11 NOTE — ED Triage Notes (Signed)
Pt was assisting to lift her husband felt a sharp abd pain, pt reported rectal bleeding and on arrival here pt had large portion of rectum protruding from rectum. Pt given 200 mg fentanyl enroute.

## 2016-02-11 NOTE — Anesthesia Procedure Notes (Signed)
Procedure Name: Intubation Date/Time: 02/11/2016 6:02 AM Performed by: Mosie Epstein Pre-anesthesia Checklist: Patient identified, Emergency Drugs available, Suction available, Patient being monitored and Timeout performed Patient Re-evaluated:Patient Re-evaluated prior to inductionOxygen Delivery Method: Circle system utilized Preoxygenation: Pre-oxygenation with 100% oxygen Intubation Type: IV induction and Rapid sequence Ventilation: Mask ventilation without difficulty Laryngoscope Size: Mac and 3 Grade View: Grade I Tube type: Subglottic suction tube Tube size: 7.5 mm Number of attempts: 1 Airway Equipment and Method: Stylet Placement Confirmation: ETT inserted through vocal cords under direct vision,  positive ETCO2 and breath sounds checked- equal and bilateral Secured at: 22 cm Tube secured with: Tape Dental Injury: Teeth and Oropharynx as per pre-operative assessment

## 2016-02-11 NOTE — Op Note (Addendum)
OPERATIVE REPORT  DATE OF OPERATION: 02/11/2016  PATIENT:  Wendy Espinoza  76 y.o. female  PRE-OPERATIVE DIAGNOSIS:  eviseration small bowel  POST-OPERATIVE DIAGNOSIS: Rectal perforation with small bowel transrectal evisceration through hole (8cm) in anterior rectal wall  INDICATION(S) FOR OPERATION:  Small bowel evisceration through perineum with abdominal pain  FINDINGS:  8 cm hole in anterior rectal wall with small bowel passage through the hole.  Markedly redundant sigmoid colon with hard stool packed.  PROCEDURE:  Procedure(s): EXPLORATORY LAPAROTOMY , REDUCTION OF EVISERATION  COLON RESECTION SIGMOID COLOSTOMY  SURGEON:  Surgeon(s): Jimmye NormanJames Daksha Koone, MD  ASSISTANT: None  ANESTHESIA:   general  COMPLICATIONS:  Nonoe  EBL: <100 ml  BLOOD ADMINISTERED: none  DRAINS: Nasogastric Tube and Urinary Catheter (Foley)   SPECIMEN:  Source of Specimen:  Redundant sigmoid colon and rectum with perforation.  COUNTS CORRECT:  YES  PROCEDURE DETAILS: The patient was taken to the operating room and placed on the table in the supine position. After an adequate general endotracheal anesthetic was administered, she was placed in the lithotomy position with the peritoneum was not prepped. An attempt was made to reduce the small bowel through the perineal wound but the actual site of perforation cannot be noted in the felt as "passing it through the anus.  Her abdomen was prepped and draped in usual sterile manner. Her lithotomy position was reduced where the Leksell unit the table but she was maintained in steroids. A proper timeout was performed identifying the patient and procedure to be performed. A lower midline incision was made from the pubic crest up to the umbilicus. We went down through in the peritoneum and the fascia into the peritoneal cavity.  The patient was placed in Trendelenburg position. She had a markedly redundant sigmoid colon which was immediately noted in the peritoneal  cavity. Upon mobilizing the bowel proximally we could see the small bowel going down towards the pelvis were with some external pressure by the nurse assistant underneath the drape were able to reduce the small bowel back into the peritoneal cavity which appeared to be completely viable.  Once the small bowel was reduced we could see that it was exiting the peritoneal cavity through a large 8 cm anterior rectal wall perforation. 2 large hard balls of stool were noted in the pelvis also which had been extruded through the perforation.  In order to fix his problem a colectomy and colostomy was necessary. The redundant sigmoid colon was resected and a colostomy brought out the left upper quadrant. We mobilized the distal sigmoid colon and rectum down to the peritoneal reflection and used a Contour stapler to come across the distal rectum above and at the peritoneal reflection. This area was not marked with suture.  Once the perforated segment was removed a LigaSure device was used to take down the mesentery posteriorly. Care was taken to stay close to the bowel wall during this procedure. Just proximal to the redundancy of the sigmoid colon we came across that with a GIA-75 stapler and approximately 2 feet of redundant sigmoid colon was removed. We brought out the colostomy in the left upper quadrant. We irrigated with copious amounts of saline solution and then we closed.  The fascia was closed using running looped 0 PDS suture. The skin was left open for wet-to-dry dressings. The colostomy was matured with interrupted 3-0 Vicryl sutures. All counts were correct including needles, sponges, and instrument.  PATIENT DISPOSITION:  PACU - hemodynamically stable.   Derian Pfost  1/18/20187:34 AM

## 2016-02-12 ENCOUNTER — Encounter (HOSPITAL_COMMUNITY): Payer: Self-pay | Admitting: General Surgery

## 2016-02-12 DIAGNOSIS — E43 Unspecified severe protein-calorie malnutrition: Secondary | ICD-10-CM | POA: Insufficient documentation

## 2016-02-12 LAB — CBC
HEMATOCRIT: 22.2 % — AB (ref 36.0–46.0)
Hemoglobin: 6.2 g/dL — CL (ref 12.0–15.0)
MCH: 17.3 pg — ABNORMAL LOW (ref 26.0–34.0)
MCHC: 27.9 g/dL — AB (ref 30.0–36.0)
MCV: 61.8 fL — AB (ref 78.0–100.0)
Platelets: 306 10*3/uL (ref 150–400)
RBC: 3.59 MIL/uL — ABNORMAL LOW (ref 3.87–5.11)
RDW: 20.2 % — AB (ref 11.5–15.5)
WBC: 12.8 10*3/uL — ABNORMAL HIGH (ref 4.0–10.5)

## 2016-02-12 LAB — FERRITIN: FERRITIN: 12 ng/mL (ref 11–307)

## 2016-02-12 LAB — BASIC METABOLIC PANEL
Anion gap: 4 — ABNORMAL LOW (ref 5–15)
BUN: 23 mg/dL — AB (ref 6–20)
CHLORIDE: 104 mmol/L (ref 101–111)
CO2: 26 mmol/L (ref 22–32)
CREATININE: 0.73 mg/dL (ref 0.44–1.00)
Calcium: 8.3 mg/dL — ABNORMAL LOW (ref 8.9–10.3)
GFR calc Af Amer: >60 mL/min (ref >60)
GFR calc non Af Amer: >60 mL/min (ref >60)
GLUCOSE: 115 mg/dL — AB (ref 65–99)
POTASSIUM: 4.2 mmol/L (ref 3.5–5.1)
Sodium: 134 mmol/L — ABNORMAL LOW (ref 135–145)

## 2016-02-12 LAB — IRON AND TIBC
Iron: <5 ug/dL — ABNORMAL LOW (ref 28–170)
TIBC: 395 ug/dL (ref 250–450)

## 2016-02-12 LAB — PREPARE RBC (CROSSMATCH)

## 2016-02-12 LAB — HEMOGLOBIN AND HEMATOCRIT, BLOOD
HCT: 27.2 % — ABNORMAL LOW (ref 36.0–46.0)
Hemoglobin: 8.1 g/dL — ABNORMAL LOW (ref 12.0–15.0)

## 2016-02-12 LAB — VITAMIN B12: Vitamin B-12: 672 pg/mL (ref 180–914)

## 2016-02-12 MED ORDER — PRO-STAT SUGAR FREE PO LIQD
30.0000 mL | Freq: Three times a day (TID) | ORAL | Status: DC
  Administered 2016-02-12 – 2016-02-23 (×27): 30 mL via ORAL
  Filled 2016-02-12 (×25): qty 30

## 2016-02-12 MED ORDER — METHOCARBAMOL 500 MG PO TABS
500.0000 mg | ORAL_TABLET | Freq: Four times a day (QID) | ORAL | Status: DC | PRN
  Administered 2016-02-12 – 2016-02-17 (×9): 500 mg via ORAL
  Filled 2016-02-12 (×9): qty 1

## 2016-02-12 MED ORDER — HYDRALAZINE HCL 20 MG/ML IJ SOLN: 5.0000 mg | Freq: Four times a day (QID) | INTRAMUSCULAR | Status: DC | PRN

## 2016-02-12 MED ORDER — OXYCODONE HCL 5 MG PO TABS
5.0000 mg | ORAL_TABLET | ORAL | Status: DC | PRN
  Administered 2016-02-12 – 2016-02-19 (×19): 10 mg via ORAL
  Filled 2016-02-12 (×21): qty 2

## 2016-02-12 MED ORDER — MODAFINIL 100 MG PO TABS
400.0000 mg | ORAL_TABLET | Freq: Every day | ORAL | Status: DC
  Administered 2016-02-12 – 2016-02-23 (×11): 400 mg via ORAL
  Filled 2016-02-12 (×13): qty 4

## 2016-02-12 MED ORDER — DIPHENHYDRAMINE HCL 25 MG PO CAPS
25.0000 mg | ORAL_CAPSULE | Freq: Once | ORAL | Status: AC
  Administered 2016-02-12: 25 mg via ORAL
  Filled 2016-02-12: qty 1

## 2016-02-12 MED ORDER — DULOXETINE HCL 30 MG PO CPEP
30.0000 mg | ORAL_CAPSULE | Freq: Every day | ORAL | Status: DC
  Administered 2016-02-12 – 2016-02-23 (×12): 30 mg via ORAL
  Filled 2016-02-12 (×12): qty 1

## 2016-02-12 MED ORDER — LEVOTHYROXINE SODIUM 100 MCG PO TABS
100.0000 ug | ORAL_TABLET | Freq: Every day | ORAL | Status: DC
  Administered 2016-02-12 – 2016-02-23 (×12): 100 ug via ORAL
  Filled 2016-02-12 (×12): qty 1

## 2016-02-12 MED ORDER — ADULT MULTIVITAMIN W/MINERALS CH
1.0000 | ORAL_TABLET | Freq: Every day | ORAL | Status: DC
  Administered 2016-02-13 – 2016-02-23 (×11): 1 via ORAL
  Filled 2016-02-12 (×11): qty 1

## 2016-02-12 MED ORDER — SODIUM CHLORIDE 0.9 % IV SOLN
Freq: Once | INTRAVENOUS | Status: AC
  Administered 2016-02-12: 11:00:00 via INTRAVENOUS

## 2016-02-12 MED ORDER — OLOPATADINE HCL 0.1 % OP SOLN
1.0000 [drp] | Freq: Two times a day (BID) | OPHTHALMIC | Status: DC
  Administered 2016-02-13 – 2016-02-23 (×21): 1 [drp] via OPHTHALMIC
  Filled 2016-02-12: qty 5

## 2016-02-12 MED ORDER — ARTIFICIAL TEARS OP OINT
TOPICAL_OINTMENT | Freq: Every day | OPHTHALMIC | Status: DC
  Administered 2016-02-13: 1 via OPHTHALMIC
  Administered 2016-02-14 – 2016-02-19 (×6): via OPHTHALMIC
  Administered 2016-02-20: 1 via OPHTHALMIC
  Administered 2016-02-21 – 2016-02-23 (×3): via OPHTHALMIC
  Filled 2016-02-12 (×3): qty 3.5

## 2016-02-12 MED ORDER — CLONAZEPAM 0.5 MG PO TABS
0.5000 mg | ORAL_TABLET | Freq: Every day | ORAL | Status: DC
  Administered 2016-02-12 – 2016-02-22 (×11): 0.5 mg via ORAL
  Filled 2016-02-12 (×11): qty 1

## 2016-02-12 MED ORDER — MORPHINE SULFATE (PF) 2 MG/ML IV SOLN
2.0000 mg | INTRAVENOUS | Status: DC | PRN
  Administered 2016-02-12 – 2016-02-16 (×4): 2 mg via INTRAVENOUS
  Administered 2016-02-17: 4 mg via INTRAVENOUS
  Administered 2016-02-17: 3 mg via INTRAVENOUS
  Administered 2016-02-17 (×2): 4 mg via INTRAVENOUS
  Administered 2016-02-17: 2 mg via INTRAVENOUS
  Administered 2016-02-18 (×2): 4 mg via INTRAVENOUS
  Administered 2016-02-19: 2 mg via INTRAVENOUS
  Filled 2016-02-12: qty 2
  Filled 2016-02-12 (×2): qty 1
  Filled 2016-02-12: qty 2
  Filled 2016-02-12 (×2): qty 1
  Filled 2016-02-12 (×3): qty 2
  Filled 2016-02-12 (×2): qty 1
  Filled 2016-02-12: qty 2

## 2016-02-12 MED ORDER — WHITE PETROLATUM GEL
Status: AC
  Filled 2016-02-12: qty 1

## 2016-02-12 NOTE — Consult Note (Signed)
WOC Nurse wound consult note Reason for Consult: Requested to apply Vac dressing to abd wound, surgical team is following for assessment and plan of care. Wound type: Full thickness post-op Measurement: 12X4.5X.3cm Wound bed: beefy red Drainage (amount, consistency, odor) small amt yellow drainage, no odor Periwound: Intact skin surrounding Dressing procedure/placement/frequency: Applied one piece black foam to 125mm cont suction.  Pt tolerated with mod amt discomfort.  Plan to change dressing on Monday with ostomy pouch change. Surgical team at the bedside to assess wound appearance. Cammie Mcgeeawn Raquon Milledge MSN, RN, CWOCN, ChandlerWCN-AP, CNS [phone]

## 2016-02-12 NOTE — Progress Notes (Signed)
Talked with pt's sister in law about pt's progress. Sister in law, Rexene AlbertsBridgett, is hoping the pt can be discharged to the same facility where the pt's husband will be. This is likely going to be [facility]. I will consult case management to see if this is possible. PT consult pending.   Wound vac ordered for pt's midline incision. Midline incision with good granulation tissue, no discharge or foul odor, no bleeding.     Mattie MarlinJessica Alannah Averhart, [facility] Pager [phone]

## 2016-02-12 NOTE — Progress Notes (Signed)
Initial Nutrition Assessment  DOCUMENTATION CODES:   Severe malnutrition in context of social or environmental circumstances  INTERVENTION:   -30 ml Prostat TID, each supplement provides 100 kcals and 15 grams protein -MVI daily  NUTRITION DIAGNOSIS:   Malnutrition related to social / environmental circumstances as evidenced by moderate depletion of body fat, severe depletion of body fat, moderate depletions of muscle mass, severe depletion of muscle mass, energy intake < 75% for > or equal to 3 months.  GOAL:   Patient will meet greater than or equal to 90% of their needs  MONITOR:   PO intake, Supplement acceptance, Diet advancement, Labs, Skin, I & O's  REASON FOR ASSESSMENT:   Consult Assessment of nutrition requirement/status  ASSESSMENT:   Pt is a 76 year old female. History of multiple rectal prolapses was picking up her 200 lb. Husband when she felt a sharp tear in her rectum with subsequent bleeding.   S/p Procedure(s) 02/11/16: EXPLORATORY LAPAROTOMY , REDUCTION OF EVISERATION ,REPAIR PELVIC FLOOR COLON RESECTION SIGMOID COLOSTOMY  Spoke with pt at bedside, who was very grateful to be allowed water and is looking forward to trying clear liquids. Per discussion with RN, pt NGT has been clamped and has been consuming water and medications without difficulty.   Pt reports that her appetite has been poor for several years, stating that "nothing tastes good after getting old". She reports "I live off of sugar free ice cream", but also shares she consumes nutrition bars and protein powder with almond milk. She also takes mineral supplements including vitamin C. 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 Suspect this may also be related to her poor oral intake.   Pt reports UBW is 115# and has always been small framed. She also noted that both of her parents were also slender framed.   Nutrition-Focused  physical exam completed. Findings are moderate to severe fat depletion, moderate to severe muscle depletion, and no edema.   Discussed with pt rationale and items available on clear liquid diet. Provided pt with supplement options; suspect pt has some degree of restrictive eating behavior, as she questioned sugar content in supplements. Pt amenable to prostat and MVI.   Labs reviewed: Na: 134 (on IV supplementation).   Diet Order:  Diet clear liquid Room service appropriate? Yes; Fluid consistency: Thin  Skin:  Reviewed, no issues  Last BM:  02/11/16  Height:   Ht Readings from Last 1 Encounters:  02/11/16 5' 2.5" (1.588 m)    Weight:   Wt Readings from Last 1 Encounters:  02/11/16 115 lb (52.2 kg)    Ideal Body Weight:  51.1 kg  BMI:  Body mass index is 20.7 kg/m.  Estimated Nutritional Needs:   Kcal:  1550-1750  Protein:  75-90 grams  Fluid:  1.5-1.7 L  EDUCATION NEEDS:   Education needs addressed  Haroldine Redler A. Mayford KnifeWilliams, RD, LDN, CDE Pager: [phone] After hours Pager: [phone]

## 2016-02-12 NOTE — Consult Note (Signed)
WOC Nurse ostomy consult note Stoma type/location:  Colostomy stoma to LLQ from surgery yesterday. Stomal assessment/size: 1 3/4 inches, red and viable, above skin level, slightly edematous and weeping Peristomal assessment: Intact skin surrounding Output: No stool or flatus  Ostomy pouching: 1pc.  Education provided:  Demonstrated pouch change using one piece pouch and barrier ring to maintain seal.  Pt was able to open and close velcro to empty.  She asked appropriate questions and discussed pouching routines and ordering supplies.  Extra pouching materials left at the bedside for staff nurse use. Enrolled [facility] in [facility] program: Yes Will re-assess on Monday for another teaching session. Cammie Mcgeeawn Barth Trella MSN, RN, CWOCN, [place]WCN-AP, CNS [phone]

## 2016-02-12 NOTE — Progress Notes (Addendum)
I have seen and examined this patient and agree with the assessment and plan of APP note.   No nausea or vomiting, pain in back severe, baseline back pain. -clamp ng, ok for liquids and meds -iron studies for recurrent anemia  [facility] Progress Note  1 Day Post-Op  Subjective: Pt's abdominal pain is improved from yesterday. No nausea or vomiting. Pt states a history of anemia and fatigue. She takes vitamin B12 daily. She eats mainly ice cream and protein shakes. She has not had much of an appetite since her husband was diagnosed with stage 4 pancreatic cancer. She is his care giver. She has had previous workup for anemia and fatigue although is unsure of her baseline Hg. Pt has a history of back surgeries. Complaining of upper back pain today.   Objective: Vital signs in last 24 hours: Temp:  [98.3 F (36.8 C)-100.5 F (38.1 C)] 100.5 F (38.1 C) (01/19 0539) Pulse Rate:  [83-101] 101 (01/19 0539) Resp:  [16-17] 17 (01/19 0539) BP: (150-181)/(57-75) 180/68 (01/19 0539) SpO2:  [96 %-100 %] 99 % (01/19 0539) Last BM Date: 02/11/15  Intake/Output from previous day: 01/18 0701 - 01/19 0700 In: 1850 [P.O.:60; I.V.:1735; IV Piggyback:55] Out: 740 [Urine:680; Emesis/NG output:60]  PE: Gen:  Alert, NAD, pleasant, thin pale woman lying in bed HEENT: oropharynx clear, mucus membranes pale and dry, NG tube in place to suction Card:  RRR, systolic murmur noted, no rubs or gallops, 2+ radial pulses bilaterally Pulm:  CTA anteriorly, no W/R/R Abd: Soft, non distended, hypoactive BS, generalized and appropriate TTP, midline incision with clean dressing, ostomy with pink stoma, no gas or stool noted in bag Ext:  No erythema, edema, or tenderness Psych: appropriate mood and affect  Lab Results:   Recent Labs  02/11/16 0506 02/11/16 0629 02/12/16 0631  WBC 5.5  --  12.8*  HGB 6.7* 8.8* 6.2*  HCT 24.1* 26.0* 22.2*  PLT 439*  --  306   BMET  Recent Labs   02/11/16 0506 02/11/16 0629 02/12/16 0631  NA 138 139 134*  K 3.5 3.6 4.2  CL 103  --  104  CO2 25  --  26  GLUCOSE 153*  --  115*  BUN 28*  --  23*  CREATININE 0.54  --  0.73  CALCIUM 8.8*  --  8.3*   PT/INR  Recent Labs  02/11/16 0506  LABPROT 13.1  INR 0.99   CMP     Component Value Date/Time   NA 134 (L) 02/12/2016 0631   K 4.2 02/12/2016 0631   CL 104 02/12/2016 0631   CO2 26 02/12/2016 0631   GLUCOSE 115 (H) 02/12/2016 0631   BUN 23 (H) 02/12/2016 0631   CREATININE 0.73 02/12/2016 0631   CALCIUM 8.3 (L) 02/12/2016 0631   GFRNONAA >60 02/12/2016 0631   GFRAA >60 02/12/2016 0631   Anti-infectives: Anti-infectives    Start     Dose/Rate Route Frequency Ordered Stop   02/11/16 1800  cefoTEtan (CEFOTAN) 1 g in dextrose 5 % 50 mL IVPB     1 g 100 mL/hr over 30 Minutes Intravenous Once 02/11/16 0901     02/11/16 0515  cefoTEtan (CEFOTAN) 1 g in dextrose 5 % 50 mL IVPB     1 g 100 mL/hr over 30 Minutes Intravenous  Once 02/11/16 0510 02/11/16 0555     Home Meds per EMR:   albuterol (VENTOLIN HFA) 90 mcg/actuation inhaler Inhale 2 puffs every six (6) hours as  needed for wheezing.  . carisoprodol (SOMA) 350 MG tablet Take 350 mg by mouth Three (3) times a day as needed for muscle spasms.  . cetirizine (ZYRTEC) 10 MG tablet Take 10 mg by mouth daily as needed. Frequency: Dosage:0.0 Instructions: Note:  . clonazePAM (KLONOPIN) 0.5 MG tablet Take 0.5 mg by mouth nightly.  Marland Kitchen. DIPHENHYDRAMINE HCL (MAGIC MOUTHWASH ORAL) suspension SWISH AND SWALLOW ONE TEASPOONFUL FIVE TIMES DAILY 0  . diphenoxylate-atropine (LOMOTIL) 2.5-0.025 mg per tablet Take 1 tablet by mouth Every six (6) hours. (Patient taking differently: Take 1 tablet by mouth 4 (four) times a day as needed (PATIENT STATES DIDNT MAKE A DIFFERENCE). ) 30 tablet 0  . DULoxetine (CYMBALTA) 30 MG capsule Take 30 mg by mouth daily.  . fentaNYL (DURAGESIC) 50 mcg/hr patch Place 1 patch on the skin every other day.  .  levothyroxine (SYNTHROID, LEVOTHROID) 100 MCG tablet TAKE ONE TABLET BY MOUTH EVERY DAY 30 tablet 11  . modafinil (PROVIGIL) 200 MG tablet Take 400 mg by mouth daily.  Marland Kitchen. omeprazole (PRILOSEC) 40 MG capsule TAKE ONE CAPSULE BY MOUTH EVERY DAY 90 capsule 3  . oxyCODONE (ROXICODONE) 15 MG immediate release tablet Take 15 mg by mouth every eight (8) hours as needed.  Marland Kitchen. PATADAY 0.2 % ophthalmic solution Administer 1 drop to both eyes two (2) times a day as needed. 3  . peg 400-propylene glycol (SYSTANE) 0.4-0.3 % Drop Administer 1 drop to both eyes daily with breakfast.  . promethazine (PHENERGAN) 25 MG tablet Take 1 tablet (25 mg total) by mouth every eight (8) hours as needed for nausea. 90 tablet 1  . rOPINIRole (REQUIP) 2 MG tablet Take 6 mg by mouth Two (2) times a day. AT NAP AND AT BEDTIME       Assessment/Plan Rectal perforation with small bowel transrectal evisceration POD#1 S/P EXPLORATORY LAPAROTOMY , REDUCTION OF EVISERATION ,REPAIR PELVIC FLOOR COLON RESECTION SIGMOID, COLOSTOMY (02/11/16, Dr. Jimmye NormanJames Wyatt)  Hgb 6.2/Hct 22.2   WBC 12.8, repeat CBC in AM   Pain control: morphine, fentanyl patch, IV robaxin   Appreciated WOC assistance with pt education  Will transfuse 2 Units of PRBC  Iron panel and B12 labs pending  Hypertension - PRN hydralazine GERD - IV protonix Urinary incontinence  Hypothyroidism - Synthroid  Anemia: 2 units of PRBC today, iron panel and B12 pending   FEN: NPO, IVF, NGT clamped ID: cefotetan x 1 dose 1/18,  VTE: lovenox, SCD's  - no return of bowel function yet. No gas or stool in ostomy. Few BS heard. NGT in place, no nausea or vomiting and only 60mL NGT output in 24hrs. Will await some return of bowel function before pulling NGT. Will clamp today to see how pt tolerates. Pt anemic and will get 2 units of PRBC today. Iron panel pending. AM labs. PT consult for ambulation. Pt has a history of back surgeries. Nutrition consult for malnutrition. D/C foley.  Ordered home meds.   LOS: 1 day    Adam PhenixElizabeth S Simaan , [facility] 02/12/2016, 9:17 AM Pager: [phone] Consults: [phone] Mon-Fri 7:00 am-4:30 pm Sat-Sun 7:00 am-11:30 am

## 2016-02-12 NOTE — Progress Notes (Signed)
PT Cancellation Note  Patient Details Name: Wendy Espinoza MRN: [id number] DOB: 12/25/1940   Cancelled Treatment:    Reason Eval/Treat Not Completed: Medical issues which prohibited therapy; noted hemoglobin 6.2.  Will follow up after transfusion.   Elray McgregorCynthia Ajah Vanhoose 02/12/2016, 11:51 AM  Sheran Lawlessyndi Azia Toutant, PT [phone] 02/12/2016

## 2016-02-13 LAB — CBC
HCT: 25.1 % — ABNORMAL LOW (ref 36.0–46.0)
Hemoglobin: 7.4 g/dL — ABNORMAL LOW (ref 12.0–15.0)
MCH: 19.4 pg — AB (ref 26.0–34.0)
MCHC: 29.5 g/dL — AB (ref 30.0–36.0)
MCV: 65.7 fL — ABNORMAL LOW (ref 78.0–100.0)
PLATELETS: 257 10*3/uL (ref 150–400)
RBC: 3.82 MIL/uL — AB (ref 3.87–5.11)
RDW: 22.5 % — ABNORMAL HIGH (ref 11.5–15.5)
WBC: 11.1 10*3/uL — ABNORMAL HIGH (ref 4.0–10.5)

## 2016-02-13 LAB — BASIC METABOLIC PANEL
Anion gap: 5 (ref 5–15)
BUN: 11 mg/dL (ref 6–20)
CALCIUM: 8.2 mg/dL — AB (ref 8.9–10.3)
CO2: 27 mmol/L (ref 22–32)
CREATININE: 0.56 mg/dL (ref 0.44–1.00)
Chloride: 101 mmol/L (ref 101–111)
GFR calc Af Amer: >60 mL/min (ref >60)
Glucose, Bld: 103 mg/dL — ABNORMAL HIGH (ref 65–99)
POTASSIUM: 3.6 mmol/L (ref 3.5–5.1)
SODIUM: 133 mmol/L — AB (ref 135–145)

## 2016-02-13 LAB — TYPE AND SCREEN
ABO/RH(D): O POS
ANTIBODY SCREEN: NEGATIVE
UNIT DIVISION: 0
UNIT DIVISION: 0
UNIT DIVISION: 0

## 2016-02-13 NOTE — Evaluation (Signed)
Physical Therapy Evaluation Patient Details Name: Wendy Espinoza MRN: [id number] DOB: 1940-10-04 Today's Date: 02/13/2016   History of Present Illness  Patient is a 76 yo female admitted 02/11/16 with eviscerated small bowel through perineum.  Patient s/p reduction of evisceration, colon resection, colostomy, and repair of pelvic floor on 02/11/16.     PMH:  Kyphosis, polio, back surgery, urinary incontinence, back pain, anemia.  Clinical Impression  Patient presents with problems listed below.  Will benefit from acute PT to maximize functional mobility prior to discharge.  Patient with general weakness impacting balance, mobility, and safety.  Recommend patient d/c to SNF for continued therapy.    Follow Up Recommendations SNF;Supervision/Assistance - 24 hour    Equipment Recommendations  None recommended by PT    Recommendations for Other Services       Precautions / Restrictions Precautions Precautions: Fall Precaution Comments: VAC for abdominal wound;  colostomy Restrictions Weight Bearing Restrictions: No      Mobility  Bed Mobility Overal bed mobility: Modified Independent             General bed mobility comments: Increased time and use of bed rails.  Transfers Overall transfer level: Needs assistance Equipment used: Rolling walker (2 wheeled) Transfers: Sit to/from Stand Sit to Stand: Min assist         General transfer comment: Verbal cues for hand placement.  Assist to rise from bed and toilet, and for balance.  Ambulation/Gait Ambulation/Gait assistance: Min assist Ambulation Distance (Feet): 76 Feet Assistive device: Rolling walker (2 wheeled) Gait Pattern/deviations: Step-through pattern;Decreased stride length;Shuffle;Trunk flexed Gait velocity: decreased Gait velocity interpretation: Below normal speed for age/gender General Gait Details: Patient with flexed posture and slow gait speed.  Cues to try to stand upright as much as  possible.  Stairs            Wheelchair Mobility    Modified Rankin (Stroke Patients Only)       Balance Overall balance assessment: Needs assistance         Standing balance support: Bilateral upper extremity supported Standing balance-Leahy Scale: Poor                               Pertinent Vitals/Pain Pain Assessment: 0-10 Pain Score: 6  Pain Location: Abdomen Pain Descriptors / Indicators: Discomfort;Sore Pain Intervention(s): Limited activity within patient's tolerance;Monitored during session;Repositioned    Home Living Family/patient expects to be discharged to:: Skilled nursing facility Living Arrangements: Spouse/significant other             Home Equipment: Walker - 2 wheels;Walker - 4 wheels;Cane - single point;Shower seat;Wheelchair - manual;Hand held shower head Additional Comments: Per chart, trying to get patient and husband in same SNF ([facility]).    Prior Function Level of Independence: Independent with assistive device(s)         Comments: Using rollator for ambulation.  Independent with bathing, meal prep.     Hand Dominance        Extremity/Trunk Assessment   Upper Extremity Assessment Upper Extremity Assessment: Overall WFL for tasks assessed    Lower Extremity Assessment Lower Extremity Assessment: Generalized weakness    Cervical / Trunk Assessment Cervical / Trunk Assessment: Kyphotic  Communication   Communication: No difficulties  Cognition Arousal/Alertness: Awake/alert Behavior During Therapy: WFL for tasks assessed/performed;Anxious Overall Cognitive Status: Within Functional Limits for tasks assessed  General Comments General comments (skin integrity, edema, etc.): VAC on abdominal wound;  colostomy    Exercises     Assessment/Plan    PT Assessment Patient needs continued PT services  PT Problem List Decreased strength;Decreased activity tolerance;Decreased  balance;Decreased mobility;Decreased knowledge of use of DME;Pain          PT Treatment Interventions DME instruction;Gait training;Functional mobility training;Therapeutic activities;Therapeutic exercise;Patient/family education    PT Goals (Current goals can be found in the Care Plan section)  Acute Rehab PT Goals Patient Stated Goal: To get stronger PT Goal Formulation: With patient Time For Goal Achievement: 02/20/16 Potential to Achieve Goals: Good    Frequency Min 3X/week   Barriers to discharge Decreased caregiver support 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      Co-evaluation               End of Session   Activity Tolerance: Patient limited by fatigue;Patient limited by pain Patient left: in bed;with call bell/phone within reach;with bed alarm set;with family/visitor present Nurse Communication: Mobility status         Time: [id number] PT Time Calculation (min) (ACUTE ONLY): 25 min   Charges:   PT Evaluation $PT Eval Moderate Complexity: 1 Procedure PT Treatments $Gait Training: 8-22 mins   PT G Codes:        Vena AustriaSusan H Tayley Mudrick 02/13/2016, 2:30 PM Durenda HurtSusan H. Renaldo Fiddleravis, PT, [facility] Acute Rehab Services Pager [phone]

## 2016-02-13 NOTE — Progress Notes (Signed)
2 Days Post-Op  Subjective: Complains of some soreness but manageable. No appetite. Some nausea but no vomiting  Objective: Vital signs in last 24 hours: Temp:  [98.4 F (36.9 C)-99.4 F (37.4 C)] 98.8 F (37.1 C) (01/20 0600) Pulse Rate:  [87-96] 89 (01/20 0600) Resp:  [16-20] 18 (01/20 0600) BP: (161-169)/(57-84) 167/57 (01/20 0600) SpO2:  [94 %-98 %] 97 % (01/20 0600) Last BM Date:  (pta)  Intake/Output from previous day: 01/19 0701 - 01/20 0700 In: 2625 [P.O.:600; I.V.:1350; Blood:675] Out: 1755 [Urine:1750; Drains:5] Intake/Output this shift: Total I/O In: 1110 [P.O.:360; I.V.:750] Out: 1755 [Urine:1750; Drains:5]  Resp: clear to auscultation bilaterally Cardio: regular rate and rhythm GI: soft, moderate tenderness. ostomy pink no output yet. vac in place  Lab Results:   Recent Labs  02/12/16 0631 02/12/16 1955 02/13/16 0521  WBC 12.8*  --  11.1*  HGB 6.2* 8.1* 7.4*  HCT 22.2* 27.2* 25.1*  PLT 306  --  257   BMET  Recent Labs  02/12/16 0631 02/13/16 0521  NA 134* 133*  K 4.2 3.6  CL 104 101  CO2 26 27  GLUCOSE 115* 103*  BUN 23* 11  CREATININE 0.73 0.56  CALCIUM 8.3* 8.2*   PT/INR  Recent Labs  02/11/16 0506  LABPROT 13.1  INR 0.99   ABG  Recent Labs  02/11/16 0629  HCO3 26.2    Studies/Results: No results found.  Anti-infectives: Anti-infectives    Start     Dose/Rate Route Frequency Ordered Stop   02/11/16 1800  cefoTEtan (CEFOTAN) 1 g in dextrose 5 % 50 mL IVPB     1 g 100 mL/hr over 30 Minutes Intravenous Once 02/11/16 0901     02/11/16 0515  cefoTEtan (CEFOTAN) 1 g in dextrose 5 % 50 mL IVPB     1 g 100 mL/hr over 30 Minutes Intravenous  Once 02/11/16 0510 02/11/16 0555      Assessment/Plan: s/p Procedure(s): EXPLORATORY LAPAROTOMY , REDUCTION OF EVISERATION ,REPAIR PELVIC FLOOR (N/A) COLON RESECTION SIGMOID (N/A) COLOSTOMY (N/A) continue offering clears until bowel function returns  Continue cefotan for perforated  colon OOB POD 2  LOS: 2 days    TOTH III,PAUL S 02/13/2016

## 2016-02-14 NOTE — Clinical Social Work Placement (Signed)
   CLINICAL SOCIAL WORK PLACEMENT  NOTE  Date:  02/14/2016  Patient Details  Name: Wendy Espinoza MRN: [id number] Date of Birth: 03/03/1940  Clinical Social Work is seeking post-discharge placement for this patient at the Skilled  Nursing Facility level of care (*CSW will initial, date and re-position this form in  chart as items are completed):      Patient/family provided with [facility] Clinical Social Work Department's list of facilities offering this level of care within the geographic area requested by the patient (or if unable, by the patient's family).      Patient/family informed of their freedom to choose among providers that offer the needed level of care, that participate in Medicare, Medicaid or managed care program needed by the patient, have an available bed and are willing to accept the patient.      Patient/family informed of [facility] ownership interest in [facility] and [facility], as well as of the fact that they are under no obligation to receive care at these facilities.  PASRR submitted to EDS on       PASRR number received on 02/14/16     Existing PASRR number confirmed on       FL2 transmitted to all facilities in geographic area requested by pt/family on 02/14/16     FL2 transmitted to all facilities within larger geographic area on       Patient informed that his/her managed care company has contracts with or will negotiate with certain facilities, including the following:            Patient/family informed of bed offers received.  Patient chooses bed at       Physician recommends and patient chooses bed at      Patient to be transferred to   on  .  Patient to be transferred to facility by       Patient family notified on   of transfer.  Name of family member notified:        PHYSICIAN Please prepare priority discharge summary, including medications, Please prepare prescriptions, Please sign FL2     Additional Comment:     _______________________________________________ Volney AmericanBridget A Mayton, LCSW 02/14/2016, 4:20 PM

## 2016-02-14 NOTE — NC FL2 (Signed)
[place] MEDICAID FL2 LEVEL OF CARE SCREENING TOOL     IDENTIFICATION  Patient Name: Wendy Espinoza Birthdate: August 24, 1940 Sex: female Admission Date (Current Location): 02/11/2016  [facility] and [place] Number:  Producer, television/film/video and Address:  [facility], [address], [place], [place] [address]      Provider Number: [id number]  Attending Physician Name and Address:  Md Montez Morita, MD  Relative Name and Phone Number:       Current Level of Care: Hospital Recommended Level of Care: Skilled Nursing Facility Prior Approval Number:    Date Approved/Denied: 02/14/16 PASRR Number: [id number]  Discharge Plan: SNF    Current Diagnoses: Patient Active Problem List   Diagnosis Date Noted  . Protein-calorie malnutrition, severe 02/12/2016  . 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)  (Please see d/c summary)  AMBULATORY STATUS COMMUNICATION OF NEEDS Skin   Limited Assist Verbally Wound Vac (Closed incision, abdomen, negative pressure wound therapy, target pressure , continuous pressure.)                       Personal Care Assistance Level of Assistance  Bathing, Feeding, Dressing Bathing Assistance: Limited assistance Feeding assistance: Independent Dressing Assistance: Limited assistance     Functional Limitations Info  Sight, Hearing, Speech Sight Info: Adequate Hearing Info: Adequate Speech Info: Adequate    SPECIAL CARE FACTORS FREQUENCY  PT (By licensed PT), OT (By licensed OT)     PT Frequency: min 3x week  OT Frequency: min 3x week            Contractures Contractures Info: Not present    Additional Factors Info  Code Status, Allergies Code Status Info: Full  Code Allergies Info: Gluten Meal, Lactose Intolerance (Gi), Topamax Topiramate, Wheat Bran, Gabapentin, Lactase, Lyrica Pregabalin, Soy Allergy, Tagamet Cimetidine, Tizanidine, Tylenol Acetaminophen, Nsaids           Current Medications (02/14/2016):  This is the current hospital active medication list Current Facility-Administered Medications  Medication Dose Route Frequency Provider Last Rate Last Dose  . artificial tears (LACRILUBE) ophthalmic ointment   Both Eyes Daily Jessica L Focht, PA      . cefoTEtan (CEFOTAN) 1 g in dextrose 5 % 50 mL IVPB  1 g Intravenous Once Jimmye Norman, MD      . clonazePAM Scarlette Calico) tablet 0.5 mg  0.5 mg Oral QHS Jerre Simon, PA   0.5 mg at 02/13/16 2143  . dextrose 5 % and 0.45 % NaCl with KCl 20 mEq/L infusion   Intravenous Continuous Jimmye Norman, MD 100 mL/hr at 02/14/16 1006    . DULoxetine (CYMBALTA) DR capsule 30 mg  30 mg Oral Daily Jessica L Focht, PA   30 mg at 02/14/16 1004  . enoxaparin (LOVENOX) injection 40 mg  40 mg Subcutaneous Q24H Jimmye Norman, MD   40 mg at 02/13/16 2143  . feeding supplement (PRO-STAT SUGAR FREE 64) liquid 30 mL  30 mL Oral TID BM Jimmye Norman, MD   30 mL at 02/14/16 1003  . fentaNYL (DURAGESIC - dosed mcg/hr) 50 mcg  50 mcg Transdermal Q72H Edson Snowball, PA-C   50 mcg at 02/11/16 1359  . hydrALAZINE (APRESOLINE)  injection 5-20 mg  5-20 mg Intravenous Q6H PRN Adam PhenixElizabeth S Simaan, PA-C      . levothyroxine (SYNTHROID, LEVOTHROID) tablet 100 mcg  100 mcg Oral QAC breakfast Jerre SimonJessica L Focht, PA   100 mcg at 02/14/16 0705  . methocarbamol (ROBAXIN) tablet 500 mg  500 mg Oral Q6H PRN Jerre SimonJessica L Focht, PA   500 mg at 02/14/16 0509  . modafinil (PROVIGIL) tablet 400 mg  400 mg Oral Daily Jerre SimonJessica L Focht, PA   400 mg at 02/13/16 [id number]  . morphine 2 MG/ML injection 2-4 mg  2-4 mg Intravenous Q1H PRN Jerre SimonJessica L Focht, PA   2 mg at 02/12/16 1703  . multivitamin with minerals tablet 1 tablet  1 tablet Oral Daily Jimmye NormanJames Wyatt, MD   1 tablet  at 02/14/16 1004  . olopatadine (PATANOL) 0.1 % ophthalmic solution 1 drop  1 drop Both Eyes BID Jerre SimonJessica L Focht, PA   1 drop at 02/14/16 1006  . oxyCODONE (Oxy IR/ROXICODONE) immediate release tablet 5-10 mg  5-10 mg Oral Q4H PRN Jerre SimonJessica L Focht, PA   10 mg at 02/14/16 0509  . pantoprazole (PROTONIX) injection 40 mg  40 mg Intravenous QHS Jimmye NormanJames Wyatt, MD   40 mg at 02/13/16 2144  . white petrolatum (VASELINE) gel   Topical PRN Edson SnowballBrooke A Miller, PA-C         Discharge Medications: Please see discharge summary for a list of discharge medications.  Relevant Imaging Results:  Relevant Lab Results:   Additional Information SSN: [id number]  Ht: 5' 2.5" (1.588 m)  Wt: 115 lb (52.2 kg)     Renatta Shrieves A Mayton, LCSW

## 2016-02-14 NOTE — Progress Notes (Signed)
3 Days Post-Op  Subjective: Feels better than yesterday. No output from ostomy yet  Objective: Vital signs in last 24 hours: Temp:  [98.9 F (37.2 C)-100.2 F (37.9 C)] 98.9 F (37.2 C) (01/21 0456) Pulse Rate:  [87-92] 87 (01/21 0456) Resp:  [18] 18 (01/21 0456) BP: (169-184)/(66-70) 169/66 (01/21 0456) SpO2:  [95 %-100 %] 95 % (01/21 0456) Last BM Date: 02/11/16  Intake/Output from previous day: 01/20 0701 - 01/21 0700 In: 2040 [P.O.:840; I.V.:1200] Out: 1750 [Urine:1750] Intake/Output this shift: Total I/O In: -  Out: 600 [Urine:600]  Resp: clear to auscultation bilaterally Cardio: regular rate and rhythm GI: soft, moderate tenderness. ostomy pink with no output yet. vac in place  Lab Results:   Recent Labs  02/12/16 0631 02/12/16 1955 02/13/16 0521  WBC 12.8*  --  11.1*  HGB 6.2* 8.1* 7.4*  HCT 22.2* 27.2* 25.1*  PLT 306  --  257   BMET  Recent Labs  02/12/16 0631 02/13/16 0521  NA 134* 133*  K 4.2 3.6  CL 104 101  CO2 26 27  GLUCOSE 115* 103*  BUN 23* 11  CREATININE 0.73 0.56  CALCIUM 8.3* 8.2*   PT/INR No results for input(s): LABPROT, INR in the last 72 hours. ABG No results for input(s): PHART, HCO3 in the last 72 hours.  Invalid input(s): PCO2, PO2  Studies/Results: No results found.  Anti-infectives: Anti-infectives    Start     Dose/Rate Route Frequency Ordered Stop   02/11/16 1800  cefoTEtan (CEFOTAN) 1 g in dextrose 5 % 50 mL IVPB     1 g 100 mL/hr over 30 Minutes Intravenous Once 02/11/16 0901     02/11/16 0515  cefoTEtan (CEFOTAN) 1 g in dextrose 5 % 50 mL IVPB     1 g 100 mL/hr over 30 Minutes Intravenous  Once 02/11/16 0510 02/11/16 0555      Assessment/Plan: s/p Procedure(s): EXPLORATORY LAPAROTOMY , REDUCTION OF EVISERATION ,REPAIR PELVIC FLOOR (N/A) COLON RESECTION SIGMOID (N/A) COLOSTOMY (N/A) stay on clears until ostomy has output  Ambulate Continue cefotan  LOS: 3 days    TOTH III,Lorayne Getchell S 02/14/2016

## 2016-02-14 NOTE — Clinical Social Work Note (Signed)
Clinical Social Work Assessment  Patient Details  Name: Wendy Espinoza MRN: [id number] Date of Birth: 12/01/1940  Date of referral:  02/14/16               Reason for consult:  Facility Placement                Permission sought to share information with:    Permission granted to share information::     Name::        Agency::     Relationship::     Contact Information:     Housing/Transportation Living arrangements for the past 2 months:  Single Family Home Source of Information:  Patient Patient Interpreter Needed:  None Criminal Activity/Legal Involvement Pertinent to Current Situation/Hospitalization:  No - Comment as needed Significant Relationships:  Spouse, Siblings Lives with:  Spouse Do you feel safe going back to the place where you live?    Need for family participation in patient care:     Care giving concerns:  Pt's daughter present at bedside during initial assessment.    Social Worker assessment / plan:  CSW spoke with pt at bedside to complete initial assessment. Pt lived at home with spouse prior to admission. However, her husband has had a stroke and on the 4515 the family found out he had stage four prostate cancer. Pt reports she got [facility] call [facility] today and they are expecting him to pass sometime soon. Pt is greiving. CSW validated pt's feelings and offered support and let the pt begin to share memories of her husband. Per pt she spoke with Dr. Pola CornWhyatt and he has agreed to let her sister take her to [facility] tomorrow to spend a few last hours with her husband and then she will return to the hospital. Pt rerports after she d/c from the hospital she will go to [facility] for long term care. CSW will send referral to [facility] and follow up with pt.  Employment status:  Retired Database administratornsurance information:  Managed Medicare PT Recommendations:  Skilled Nursing Facility Information / Referral to community resources:  Skilled Nursing  Facility  Patient/Family's Response to care:  Pt verbalized understanding of CSW role and expressed appreciation for support. Pt denies any concern regarding pt care at this time.   Patient/Family's Understanding of and Emotional Response to Diagnosis, Current Treatment, and Prognosis:  Pt understanding and realistic regarding physical limitations. Pt is agreeable to SNF placement at d/c. Pt denies any concern regarding treatment plan at this time. CSW will continue to provide support.  Emotional Assessment Appearance:  Appears stated age Attitude/Demeanor/Rapport:   (Patient was appropriate. Patient was grieving. ) Affect (typically observed):  Accepting, Appropriate, Grieving Orientation:  Oriented to Self, Oriented to Place, Oriented to  Time, Oriented to Situation Alcohol / Substance use:  Not Applicable Psych involvement (Current and /or in the community):  No (Comment)  Discharge Needs  Concerns to be addressed:  No discharge needs identified Readmission within the last 30 days:  No Current discharge risk:  Dependent with Mobility Barriers to Discharge:  Continued Medical Work up   Safeway IncBridget A Mayton, LCSW 02/14/2016, 4:15 PM

## 2016-02-14 NOTE — Care Management (Signed)
Chaplain called to visit with patient in her room.  Her [facility] is [facility] and the patient is requesting the Eucharist by a priest.  Wendy StandsChaplain contacted [facility] on call and left a message for the priest to return the call. Chaplain notified patient's nurse about the request.     Chaplain Hillery JacksLisa Anoushka Espinoza

## 2016-02-15 MED ORDER — SENNA 8.6 MG PO TABS
1.0000 | ORAL_TABLET | Freq: Every day | ORAL | Status: DC
  Administered 2016-02-15 – 2016-02-19 (×5): 8.6 mg via ORAL
  Filled 2016-02-15 (×6): qty 1

## 2016-02-15 MED ORDER — LORAZEPAM 2 MG/ML IJ SOLN
0.5000 mg | Freq: Once | INTRAMUSCULAR | Status: AC
  Administered 2016-02-15: 0.5 mg via INTRAVENOUS
  Filled 2016-02-15: qty 1

## 2016-02-15 MED ORDER — PROMETHAZINE HCL 25 MG/ML IJ SOLN: 12.5000 mg | Freq: Once | INTRAMUSCULAR | Status: DC

## 2016-02-15 NOTE — Progress Notes (Signed)
Physical Therapy Treatment Patient Details Name: Wendy Espinoza MRN: [id number] DOB: [birth year] Today's Date: 02/15/2016    History of Present Illness Patient is a 76 yo female admitted 02/11/16 with eviscerated small bowel through perineum.  Patient s/p reduction of evisceration, colon resection, colostomy, and repair of pelvic floor on 02/11/16.     PMH:  Kyphosis, polio, back surgery, urinary incontinence, back pain, anemia.    PT Comments    Pleasant and agreeable to ambulate this afternoon; we discussed the importance of ambulating daily with staff; Making good progress; she is agreeable to SNF for rehab (requests [facility])  Follow Up Recommendations  SNF;Supervision/Assistance - 24 hour     Equipment Recommendations  None recommended by PT    Recommendations for Other Services       Precautions / Restrictions Precautions Precautions: Fall Precaution Comments: VAC for abdominal wound;  colostomy    Mobility  Bed Mobility Overal bed mobility: Needs Assistance Bed Mobility: Rolling;Sidelying to Sit Rolling: Min guard Sidelying to sit: Min guard       General bed mobility comments: Cues for log roll technqiue  Transfers Overall transfer level: Needs assistance Equipment used: Rolling walker (2 wheeled) Transfers: Sit to/from Stand Sit to Stand: Min guard         General transfer comment: Cues for hand placement and safety  Ambulation/Gait Ambulation/Gait assistance: Min guard Ambulation Distance (Feet): 120 Feet Assistive device: Rolling walker (2 wheeled) Gait Pattern/deviations: Step-through pattern;Decreased stride length;Shuffle;Trunk flexed Gait velocity: decreased   General Gait Details: Patient with flexed posture and slow gait speed.  Cues to try to stand upright as much as possible.   Stairs            Wheelchair Mobility    Modified Rankin (Stroke Patients Only)       Balance             Standing balance-Leahy Scale: Poor                       Cognition Arousal/Alertness: Awake/alert Behavior During Therapy: WFL for tasks assessed/performed;Anxious Overall Cognitive Status: Within Functional Limits for tasks assessed                      Exercises      General Comments        Pertinent Vitals/Pain Pain Assessment: 0-10 Pain Score: 6  Pain Location: Abdomen Pain Descriptors / Indicators: Discomfort;Sore Pain Intervention(s): Monitored during session    Home Living                      Prior Function            PT Goals (current goals can now be found in the care plan section) Acute Rehab PT Goals Patient Stated Goal: To get stronger PT Goal Formulation: With patient Time For Goal Achievement: 02/20/16 Potential to Achieve Goals: Good Progress towards PT goals: Progressing toward goals    Frequency    Min 3X/week      PT Plan Current plan remains appropriate    Co-evaluation             End of Session   Activity Tolerance: Patient tolerated treatment well Patient left: in chair;with call bell/phone within reach;with family/visitor present     Time: 4098-1191 PT Time Calculation (min) (ACUTE ONLY): 23 min  Charges:  $Gait Training: 23-37 mins  G CodesLevi Aland:      Balian Schaller H Lamone Ferrelli 02/15/2016, 4:56 PM  Van ClinesHolly Kingsly Kloepfer, PT  Acute Rehabilitation Services Pager [phone] Office [phone]

## 2016-02-15 NOTE — Progress Notes (Signed)
[facility] Progress Note  4 Days Post-Op  Subjective: Patient sister at bedside. Patient states her pain is controlled. Denies flatus or stool is ostomy pouch. Plans for SNF at discharge.   Patient reports that she originally wanted to leave the hospital today to be with her husband who is in hospice care with stage 4 pancreatic cancer, however she received word that he has improved and has a few more days to live so she agrees to stay in the hospital for a couple more days.   Objective: Vital signs in last 24 hours: Temp:  [98.4 F (36.9 C)-100 F (37.8 C)] 100 F (37.8 C) (01/22 0446) Pulse Rate:  [81-84] 81 (01/22 0446) Resp:  [18] 18 (01/22 0446) BP: (158-186)/(62-71) 186/67 (01/22 0446) SpO2:  [96 %-98 %] 97 % (01/22 0446) Last BM Date: 02/11/16  Intake/Output from previous day: 01/21 0701 - 01/22 0700 In: 2640 [P.O.:240; I.V.:2400] Out: 2450 [Urine:2450] Intake/Output this shift: No intake/output data recorded.  PE: Gen:  Alert, NAD, pleasant Pulm:  CTAB, normal effort Abd: Soft, appropriately tender, mild distentions, midline incision is granulating well with no s/s infection; stoma edematous but viable, no stool or flatus in pouch. Ext:  No erythema, edema, or tenderness   Lab Results:   Recent Labs  02/12/16 1955 02/13/16 0521  WBC  --  11.1*  HGB 8.1* 7.4*  HCT 27.2* 25.1*  PLT  --  257   BMET  Recent Labs  02/13/16 0521  NA 133*  K 3.6  CL 101  CO2 27  GLUCOSE 103*  BUN 11  CREATININE 0.56  CALCIUM 8.2*   PT/INR No results for input(s): LABPROT, INR in the last 72 hours. CMP     Component Value Date/Time   NA 133 (L) 02/13/2016 0521   K 3.6 02/13/2016 0521   CL 101 02/13/2016 0521   CO2 27 02/13/2016 0521   GLUCOSE 103 (H) 02/13/2016 0521   BUN 11 02/13/2016 0521   CREATININE 0.56 02/13/2016 0521   CALCIUM 8.2 (L) 02/13/2016 0521   GFRNONAA >60 02/13/2016 0521   GFRAA >60 02/13/2016 0521   Lipase  No results found  for: LIPASE     Studies/Results: No results found.  Anti-infectives: Anti-infectives    Start     Dose/Rate Route Frequency Ordered Stop   02/11/16 1800  cefoTEtan (CEFOTAN) 1 g in dextrose 5 % 50 mL IVPB     1 g 100 mL/hr over 30 Minutes Intravenous Once 02/11/16 0901     02/11/16 0515  cefoTEtan (CEFOTAN) 1 g in dextrose 5 % 50 mL IVPB     1 g 100 mL/hr over 30 Minutes Intravenous  Once 02/11/16 0510 02/11/16 0555       Assessment/Plan s/p Procedure(s): EXPLORATORY LAPAROTOMY , REDUCTION OF EVISERATION ,REPAIR PELVIC FLOOR (N/A) COLON RESECTION SIGMOID (N/A) COLOSTOMY (N/A) - stay on clears until ostomy has output  - VAC changes M/W/F - d/c vac prior to discharge and do wet-to-dry dressing changes (wound is not deep enough to meet VAC criteria at discharge). - d/c IV abx - Ambulate   LOS: 4 days    Adam PhenixElizabeth S Harol Shabazz , [facility] 02/15/2016, 11:29 AM Pager: [phone] Consults: [phone] Mon-Fri 7:00 am-4:30 pm Sat-Sun 7:00 am-11:30 am

## 2016-02-15 NOTE — Care Management Important Message (Signed)
Important Message  Patient Details  Name: Wendy Espinoza MRN: [id number] Date of Birth: 04/22/1940   Medicare Important Message Given:  Yes    Dorena BodoIris Zain Bingman 02/15/2016, 11:15 AM

## 2016-02-15 NOTE — Consult Note (Addendum)
WOC Nurse wound consult note Reason for Consult:  Vac dressing changed to abd wound, surgical team is following for assessment and plan of care. Wound type: Full thickness post-op, appearance unchanged since previous consult. Wound bed: beefy red Drainage (amount, consistency, odor) small amt yellow drainage, no odor Periwound: Intact skin surrounding Dressing procedure/placement/frequency: Applied one piece black foam to 125mm cont suction.  Pt tolerated with mod amt discomfort. Surgical team at the bedside to assess wound appearance.  WOC Nurse ostomy consult note Stoma type/location:  Colostomy stoma to LLQ Stomal assessment/size: 2 inches, red and viable, above skin level, edematous and weeping Peristomal assessment: Intact skin surrounding Output: No stool or flatus  Ostomy pouching: 1pc.  Education provided:  Demonstrated pouch change using one piece pouch and barrier ring to maintain seal.  Pt was able to open and close velcro to empty.  She asked appropriate questions and discussed pouching routines and ordering supplies.  Extra pouching materials left at the bedside for staff nurse use. Enrolled patient in [facility] Secure Start DC program: Yes Cammie Mcgeeawn Gerron Guidotti MSN, RN, [place], [place]AP, [place] [phone]

## 2016-02-15 NOTE — Care Management Note (Signed)
Case Management Note  Patient Details  Name: Lenore MannerBarbara K Albo MRN: [id number] Date of Birth: 04/08/1940  Subjective/Objective:                    Action/Plan: Discussed discharge plan with patient and her sister Chip BoerVicki at bedside and sister in law Bridget [phone] via phone. After long discussion , patient agreeable to go to [facility] at discharge. Clarisse GougeBridget would like SW at call her directly, patient agreeable to same. Consulted SW and left voice mail .   Expected Discharge Date:                  Expected Discharge Plan:  Skilled Nursing Facility  In-House Referral:  Clinical Social Work  Discharge planning Services     Post Acute Care Choice:    Choice offered to:  Patient, Sibling  DME Arranged:    DME Agency:     HH Arranged:    HH Agency:     Status of Service:  Completed, signed off  If discussed at [employer] Length of [employer] Meetings, dates discussed:    Additional Comments:  Kingsley PlanWile, Oktober Glazer Marie, RN 02/15/2016, 11:28 AM

## 2016-02-16 LAB — BASIC METABOLIC PANEL
ANION GAP: 9 (ref 5–15)
BUN: 7 mg/dL (ref 6–20)
CALCIUM: 8.8 mg/dL — AB (ref 8.9–10.3)
CO2: 26 mmol/L (ref 22–32)
Chloride: 99 mmol/L — ABNORMAL LOW (ref 101–111)
Creatinine, Ser: 0.52 mg/dL (ref 0.44–1.00)
Glucose, Bld: 121 mg/dL — ABNORMAL HIGH (ref 65–99)
POTASSIUM: 3.3 mmol/L — AB (ref 3.5–5.1)
SODIUM: 134 mmol/L — AB (ref 135–145)

## 2016-02-16 MED ORDER — SENNA 8.6 MG PO TABS: 1.0000 | ORAL_TABLET | Freq: Every day | ORAL | 0 refills | Status: AC

## 2016-02-16 MED ORDER — METHOCARBAMOL 500 MG PO TABS: 500.0000 mg | ORAL_TABLET | Freq: Three times a day (TID) | ORAL | 0 refills | Status: DC | PRN

## 2016-02-16 MED ORDER — PRO-STAT SUGAR FREE PO LIQD: 30.0000 mL | Freq: Three times a day (TID) | ORAL | 0 refills | Status: DC

## 2016-02-16 NOTE — Progress Notes (Signed)
Physical Therapy Treatment Patient Details Name: Wendy Espinoza MRN: [id number] DOB: 06/09/1940 Today's Date: 02/16/2016    History of Present Illness Patient is a 76 yo female admitted 02/11/16 with eviscerated small bowel through perineum.  Patient s/p reduction of evisceration, colon resection, colostomy, and repair of pelvic floor on 02/11/16.     PMH:  Kyphosis, polio, back surgery, urinary incontinence, back pain, anemia.    PT Comments    Tearful and emotional this morning, but still agreeable to walk; more abdominal discomfort leading to more flexed posture with amb; Decr activity tolerance today compared to yesterday;   Continue to agree with plan for SNF, and noted possibility of pt choosing to go home to be with husband; in that case, I agree with HHRN/PT as well as HHOT; (if she leaves AMA, will these be covered by insurance?)  Follow Up Recommendations  SNF;Supervision/Assistance - 24 hour     Equipment Recommendations  None recommended by PT    Recommendations for Other Services       Precautions / Restrictions Precautions Precautions: Fall Precaution Comments: VAC for abdominal wound;  colostomy    Mobility  Bed Mobility               General bed mobility comments: recieved pt in bathroom  Transfers Overall transfer level: Needs assistance Equipment used: Rolling walker (2 wheeled) Transfers: Sit to/from Stand Sit to Stand: Min guard         General transfer comment: Cues for hand placement and safety  Ambulation/Gait Ambulation/Gait assistance: Min guard Ambulation Distance (Feet): 100 Feet Assistive device: Rolling walker (2 wheeled) Gait Pattern/deviations: Step-through pattern;Decreased stride length;Shuffle;Trunk flexed     General Gait Details: Patient with flexed posture and slow gait speed.  Cues to try to stand upright as much as possible. Cues for Rw proximity as well   Stairs            Wheelchair Mobility    Modified  Rankin (Stroke Patients Only)       Balance             Standing balance-Leahy Scale: Poor (approaching Fair)                      Cognition Arousal/Alertness: Awake/alert Behavior During Therapy: WFL for tasks assessed/performed;Anxious Overall Cognitive Status: Within Functional Limits for tasks assessed                      Exercises      General Comments General comments (skin integrity, edema, etc.): Emotional during session, difficult time with her husband nearing the end of life      Pertinent Vitals/Pain Pain Assessment: 0-10 Pain Score: 5  Pain Location: Abdomen Pain Descriptors / Indicators: Discomfort;Sore Pain Intervention(s): Monitored during session    Home Living                      Prior Function            PT Goals (current goals can now be found in the care plan section) Acute Rehab PT Goals Patient Stated Goal: To get stronger PT Goal Formulation: With patient Time For Goal Achievement: 02/20/16 Potential to Achieve Goals: Good Progress towards PT goals: Not progressing toward goals - comment (more emotional today with decr activity tolerance)    Frequency    Min 3X/week      PT Plan Current plan remains appropriate    Co-evaluation  End of Session   Activity Tolerance: Patient limited by fatigue Patient left: in chair;with call bell/phone within reach;with family/visitor present     Time: 1610-9604 PT Time Calculation (min) (ACUTE ONLY): 14 min  Charges:  $Gait Training: 8-22 mins                    G Codes:      Levi Aland Feb 25, 2016, 11:09 AM  Van Clines, PT  Acute Rehabilitation Services Pager [phone] Office [phone]

## 2016-02-16 NOTE — Discharge Instructions (Signed)
MIDLINE WOUND CARE: - midline dressing to be changed twice daily - supplies: sterile saline, gauze, scissors, ABD pads, tape  - remove dressing and all packing carefully, moistening with sterile saline as needed to avoid packing/internal dressing sticking to the wound. - clean edges of skin around the wound with water/gauze, making sure there is no tape debris or leakage left on skin that could cause skin irritation or breakdown. - dampen a clean gauze with sterile saline and pack wound from wound base to skin level, making sure to take note of any possible areas of wound tracking, tunneling and packing appropriately. Wound can be packed loosely. Trim kerlix to size if a whole kerlix is not required. - cover wound with a dry ABD pad/gauze and secure with tape.  - write the date/time on the dry dressing/tape to better track when the last dressing change occurred. - apply any skin protectant/powder recommended by clinician to protect skin/skin folds. - change dressing as needed if leakage occurs, wound gets contaminated, or patient requests to shower. - patient may shower daily with wound open and following the shower the wound should be dried and a clean dressing placed.    Colostomy Home Guide, Adult A colostomy is a surgical procedure to make an opening (stoma) for stool (feces) to leave your body. This surgery is done when a medical condition prevents stool from leaving your body through the end of the large intestine (rectum). During the surgery, part of the large intestine (colon) is attached to the stoma that is made in the front of your abdomen. A bag (pouch) is fitted over the stoma. Stool and gas will collect in the bag. After having this surgery, you will need to empty and change your colostomy bag as needed. You will also need to care for the stoma. How do I care for my stoma? Your stoma should look pink, red, and moist, like the inside of your cheek. At first, the stoma may be swollen,  but this swelling will go away within 6 weeks. To care for the stoma:  Keep the skin around the stoma clean and dry.  Use a clean, soft washcloth to gently wash the stoma and the skin around it.  Use warm water and only use cleansers recommended by your health care provider.  Rinse the stoma area with plain water.  Dry the area well.  Use stoma powder or ointment on your skin only as told by your health care provider. Do not use any other powders, gels, wipes, or creams on your skin.  Change your colostomy bag if your skin becomes irritated. Irritation may indicate that the bag is leaking.  Check your stoma area every day for signs of infection. Check for:  More redness, swelling, or pain.  More fluid or blood.  Pus or warmth.  Measure the stoma opening regularly and record the size. Watch for changes. Share this information with your health care provider. How do I care for my colostomy bag? The bag that fits over the stoma can have either one or two pieces.  One-piece bag: The skin barrier and the bag are combined in a single unit.  Two-piece bag: The skin barrier and the bag are separate pieces that attach to each other. Empty your bag at bedtime and whenever it is one-third to one-half full. Do not let more stool or gas build up. This could cause the bag to leak. Some colostomy bags have a built-in gas release valve. Change the bag every  3-4 days or as told by your health care provider. Also change the bag if it is leaking or separating from the skin or your skin looks irritated. How do I empty my colostomy bag? Before you leave the hospital, you will be taught how to empty your bag. Follow these basic steps: 1. Wash your hands with soap and water. 2. Sit far back on the toilet. 3. Put several pieces of toilet paper into the toilet water. This will prevent splashing as you empty the stool into the toilet. 4. Remove the clip or the velcro from the tail end of the  bag. 5. Unroll the tail, then empty stool into the toilet. 6. Clean the tail with toilet paper. 7. Reroll the tail, and close it with the clip or velcro. 8. Wash your hands again. How do I change my colostomy bag? Before you leave the hospital, you will be taught how to change your bag. Always have colostomy supplies with you, and follow these basic steps: 1. Wash your hands with soap and water. Have paper towels or tissues near you to clean any discharge. 2. Use a template to pre-cut the skin barrier. Smooth any rough edges. 3. If using a two-piece bag, attach the bag and the skin barrier to each other. Add the barrier ring, if you use one. 4. If your stools are watery, add a few cotton balls to the new bag to absorb the liquid. 5. Remove the old bag and skin barrier. Gently push the skin away from the barrier with your fingers or a warm cloth. 6. Wash your hands again. Then clean the stoma area as directed with water or with mild soap and water. Use water to rinse away any soap. 7. Dry the skin. You may use the cool setting on a hair dryer to do this. 8. If directed, apply stoma powder or skin barrier gel to the skin. 9. Dry the skin again. 10. Warm the skin barrier with your hands or a warm compress. 11. Remove the paper from the sticky (adhesive) strip of the skin barrier. 12. Press the adhesive strip onto the skin around the stoma. 13. Gently rub the skin barrier onto the skin. This creates heat that helps the barrier to stick. 14. Apply stoma tape to the edges of the skin barrier. What are some general tips?  Avoid wearing clothes that are tight directly over your stoma.  You may shower or bathe with the colostomy bag on or off. Do not use harsh or oily soaps or lotions. Dry the skin and bag after bathing.  Store all supplies in a cool, dry place. Do not leave supplies in extreme heat because parts can melt.  Whenever you leave home, take an extra skin barrier and colostomy bag  with you.  If your colostomy bag gets wet, you can dry it with a hair dryer on the cool setting.  To prevent odor, put drops of ostomy deodorizer in the colostomy bag. Your health care provider may also recommend putting ostomy lubricant inside the bag. This helps the stool to slide out of the bag more easily and completely. Contact a health care provider if:  You have more redness, swelling, or pain around your stoma.  You have more fluid or blood coming from your stoma.  Your stoma feels warm to the touch.  You have pus coming from your stoma.  Your stoma extends in or out farther than normal.  You need to change the bag every day.  You have a fever. Get help right away if:  Your stool is bloody.  You vomit.  You have trouble breathing. This information is not intended to replace advice given to you by your health care provider. Make sure you discuss any questions you have with your health care provider. Document Released: 01/13/2003 Document Revised: 05/21/2015 Document Reviewed: 05/15/2013 Elsevier Interactive Patient Education  2017 [employer].

## 2016-02-16 NOTE — Progress Notes (Signed)
[facility] Progress Note  5 Days Post-Op  Subjective: Tearful this morning. Wants to go home and be with her husband. No ostomy output. Pain controlled. Working with PT.  Objective: Vital signs in last 24 hours: Temp:  [98.7 F (37.1 C)-99.3 F (37.4 C)] 98.7 F (37.1 C) (01/23 0508) Pulse Rate:  [82-86] 86 (01/23 0508) Resp:  [17-19] 17 (01/23 0508) BP: (165-169)/(63-74) 165/74 (01/23 0508) SpO2:  [95 %-100 %] 95 % (01/23 0508) Last BM Date:  (colostomy)  Intake/Output from previous day: 01/22 0701 - 01/23 0700 In: 1201.7 [P.O.:180; I.V.:1021.7] Out: 655 [Urine:650; Drains:5] Intake/Output this shift: No intake/output data recorded.  PE: Gen:  Alert, NAD, pleasant Pulm:  CTAB, normal effort Abd: Soft, appropriately tender, mild distentions, midline incision is granulating well with no s/s infection; stoma edematous but viable, no stool or flatus in pouch. Ext:  No erythema, edema, or tenderness   Lab Results:  No results for input(s): WBC, HGB, HCT, PLT in the last 72 hours. BMET No results for input(s): NA, K, CL, CO2, GLUCOSE, BUN, CREATININE, CALCIUM in the last 72 hours. PT/INR No results for input(s): LABPROT, INR in the last 72 hours. CMP     Component Value Date/Time   NA 133 (L) 02/13/2016 0521   K 3.6 02/13/2016 0521   CL 101 02/13/2016 0521   CO2 27 02/13/2016 0521   GLUCOSE 103 (H) 02/13/2016 0521   BUN 11 02/13/2016 0521   CREATININE 0.56 02/13/2016 0521   CALCIUM 8.2 (L) 02/13/2016 0521   GFRNONAA >60 02/13/2016 0521   GFRAA >60 02/13/2016 0521   Lipase  No results found for: LIPASE     Studies/Results: No results found.  Anti-infectives: Anti-infectives    Start     Dose/Rate Route Frequency Ordered Stop   02/11/16 1800  cefoTEtan (CEFOTAN) 1 g in dextrose 5 % 50 mL IVPB     1 g 100 mL/hr over 30 Minutes Intravenous Once 02/11/16 0901     02/11/16 0515  cefoTEtan (CEFOTAN) 1 g in dextrose 5 % 50 mL IVPB     1 g 100  mL/hr over 30 Minutes Intravenous  Once 02/11/16 0510 02/11/16 0555     Assessment/Plan s/p Procedure(s): EXPLORATORY LAPAROTOMY , REDUCTION OF EVISERATION ,REPAIR PELVIC FLOOR (N/A) COLON RESECTION SIGMOID (N/A) COLOSTOMY (N/A) - stay on clears until ostomy has output - VAC changes M/W/F - d/c vac prior to discharge and do wet-to-dry dressing changes (wound is not deep enough to meet VAC criteria at discharge). - pain control: PO oxy, PO robaxin,  - Ambulate/IS   FEN: Clear liquid diet ID: Cefotetan 1 dose 1/18  Plan - as of now patient will remain admitted to the hospital - clear liquids and await bowel function. Plan to d/c to SNF when medically stable. Should patient decide to leave AMA to be with her husband she will [facility]. I have arranged a follow up appointment for her in the [facility] office 02/23/16.     LOS: 5 days    Adam PhenixElizabeth S Simaan , [facility] 02/16/2016, 8:21 AM Pager: [phone] Consults: [phone] Mon-Fri 7:00 am-4:30 pm Sat-Sun 7:00 am-11:30 am

## 2016-02-17 LAB — BASIC METABOLIC PANEL
ANION GAP: 8 (ref 5–15)
BUN: 7 mg/dL (ref 6–20)
CALCIUM: 8.8 mg/dL — AB (ref 8.9–10.3)
CO2: 27 mmol/L (ref 22–32)
Chloride: 99 mmol/L — ABNORMAL LOW (ref 101–111)
Creatinine, Ser: 0.45 mg/dL (ref 0.44–1.00)
Glucose, Bld: 116 mg/dL — ABNORMAL HIGH (ref 65–99)
Potassium: 3.2 mmol/L — ABNORMAL LOW (ref 3.5–5.1)
Sodium: 134 mmol/L — ABNORMAL LOW (ref 135–145)

## 2016-02-17 LAB — CBC
HEMATOCRIT: 33.9 % — AB (ref 36.0–46.0)
Hemoglobin: 9.9 g/dL — ABNORMAL LOW (ref 12.0–15.0)
MCH: 19.2 pg — ABNORMAL LOW (ref 26.0–34.0)
MCHC: 29.2 g/dL — ABNORMAL LOW (ref 30.0–36.0)
MCV: 65.7 fL — ABNORMAL LOW (ref 78.0–100.0)
PLATELETS: 406 10*3/uL — AB (ref 150–400)
RBC: 5.16 MIL/uL — ABNORMAL HIGH (ref 3.87–5.11)
RDW: 24.5 % — AB (ref 11.5–15.5)
WBC: 8 10*3/uL (ref 4.0–10.5)

## 2016-02-17 MED ORDER — POTASSIUM CHLORIDE 2 MEQ/ML IV SOLN
INTRAVENOUS | Status: DC
  Administered 2016-02-17 – 2016-02-18 (×2): via INTRAVENOUS
  Filled 2016-02-17 (×2): qty 1000

## 2016-02-17 MED ORDER — METHOCARBAMOL 500 MG PO TABS
500.0000 mg | ORAL_TABLET | Freq: Three times a day (TID) | ORAL | Status: DC
  Administered 2016-02-17 – 2016-02-23 (×16): 500 mg via ORAL
  Filled 2016-02-17 (×18): qty 1

## 2016-02-17 MED ORDER — SIMETHICONE 80 MG PO CHEW
80.0000 mg | CHEWABLE_TABLET | Freq: Four times a day (QID) | ORAL | Status: DC | PRN
  Administered 2016-02-17 – 2016-02-18 (×2): 80 mg via ORAL
  Filled 2016-02-17 (×2): qty 1

## 2016-02-17 MED ORDER — ONDANSETRON HCL 4 MG/2ML IJ SOLN
4.0000 mg | Freq: Four times a day (QID) | INTRAMUSCULAR | Status: DC
  Administered 2016-02-17 – 2016-02-23 (×24): 4 mg via INTRAVENOUS
  Filled 2016-02-17 (×25): qty 2

## 2016-02-17 MED ORDER — METOCLOPRAMIDE HCL 5 MG/ML IJ SOLN
5.0000 mg | Freq: Three times a day (TID) | INTRAMUSCULAR | Status: DC
  Administered 2016-02-17 – 2016-02-18 (×3): 5 mg via INTRAVENOUS
  Administered 2016-02-18: 09:00:00 via INTRAVENOUS
  Administered 2016-02-18 – 2016-02-23 (×14): 5 mg via INTRAVENOUS
  Filled 2016-02-17 (×18): qty 2

## 2016-02-17 NOTE — Progress Notes (Signed)
Pt has a temp of 102.7. Encourage IS. Pt only can get up to 250. Made Dr. Lindie SpruceWyatt aware. UA/UC has been ordered. Pt allergic to tylenol and NSAIDs. Will  continue to encourage the use of IS.

## 2016-02-17 NOTE — Progress Notes (Signed)
Patient ID: Wendy MannerBarbara K Espinoza, female   DOB: 02/19/1940, 76 y.o.   MRN: [id number]  [facility]  6 Days Post-Op  Subjective: Patient has decided that she does not want to leave. She is too uncomfortable and weak. Pain is worse than yesterday. No ostomy output. Working with PT but not ambulating very much.  Objective: Vital signs in last 24 hours: Temp:  [98.2 F (36.8 C)-99.3 F (37.4 C)] 98.2 F (36.8 C) (01/24 0550) Pulse Rate:  [80-87] 80 (01/24 0550) Resp:  [18] 18 (01/24 0550) BP: (157-172)/(63-67) 163/63 (01/24 0550) SpO2:  [96 %-98 %] 96 % (01/24 0550) Last BM Date:  (no output in colostomy yet)  Intake/Output from previous day: 01/23 0701 - 01/24 0700 In: 1277.5 [P.O.:540; I.V.:737.5] Out: 1625 [Urine:1550; Drains:75] Intake/Output this shift: No intake/output data recorded.  PE: Gen: Alert, tossing uncomfortably in bed Pulm: CTAB, normal effort Abd: Soft, globally tender R>L, hyperactive BS, moderate distention, midline incision with wound vac in place; stoma edematous but viable, no stool or flatus in pouch. Ext: No erythema, edema, or tenderness  Lab Results:  No results for input(s): WBC, HGB, HCT, PLT in the last 72 hours. BMET  Recent Labs  02/16/16 0910  NA 134*  K 3.3*  CL 99*  CO2 26  GLUCOSE 121*  BUN 7  CREATININE 0.52  CALCIUM 8.8*   PT/INR No results for input(s): LABPROT, INR in the last 72 hours. CMP     Component Value Date/Time   NA 134 (L) 02/16/2016 0910   K 3.3 (L) 02/16/2016 0910   CL 99 (L) 02/16/2016 0910   CO2 26 02/16/2016 0910   GLUCOSE 121 (H) 02/16/2016 0910   BUN 7 02/16/2016 0910   CREATININE 0.52 02/16/2016 0910   CALCIUM 8.8 (L) 02/16/2016 0910   GFRNONAA >60 02/16/2016 0910   GFRAA >60 02/16/2016 0910   Lipase  No results found for: LIPASE     Studies/Results: No results found.  Anti-infectives: Anti-infectives    Start     Dose/Rate Route Frequency Ordered Stop   02/11/16  1800  cefoTEtan (CEFOTAN) 1 g in dextrose 5 % 50 mL IVPB     1 g 100 mL/hr over 30 Minutes Intravenous Once 02/11/16 0901     02/11/16 0515  cefoTEtan (CEFOTAN) 1 g in dextrose 5 % 50 mL IVPB     1 g 100 mL/hr over 30 Minutes Intravenous  Once 02/11/16 0510 02/11/16 0555       Assessment/Plan s/p Procedure(s): EXPLORATORY LAPAROTOMY , REDUCTION OF EVISERATION ,REPAIR PELVIC FLOOR (N/A) COLON RESECTION SIGMOID (N/A) COLOSTOMY (N/A) 02/11/16 Dr. Lindie SpruceWyatt - POD 6 - stay on clears until ostomy has output - VAC changes M/W/F - d/c vac prior to discharge and do wet-to-dry dressing changes (wound is not deep enough to meet VAC criteria at discharge) - pain control: PO oxy, PO robaxin - Ambulate/IS   FEN: Clear liquid diet ID: Cefotetan 1 dose 1/18  Plan - patient very uncomfortable. Check CBC. Continue clear liquids and await bowel function. Start reglan. Plan to d/c to SNF when medically stable. Should patient decide to leave AMA to be with her husband [facility]. I have arranged a follow up appointment for her in the [facility] office 02/23/16.    LOS: 6 days    Edson SnowballBROOKE A MILLER , [facility] 02/17/2016, 12:30 PM Pager: [phone] Consults: [phone] Mon-Fri 7:00 am-4:30 pm Sat-Sun 7:00 am-11:30 am

## 2016-02-17 NOTE — Consult Note (Addendum)
WOC follow-up: Pt is struggling with the decision and is undecided if she should leave the hospital AMA to see her husband who is currently dying in Hospice.  She admits she is very nauseated and in pain and has no stool or flatus from her ostomy. Pt states she realizes she would need to return to the hospital and be re-admitted through the ER if she leaves. Vac dressing is due to be changed today, but is ordered to be removed if patient is discharged.  Will return later to change ostomy pouch and Vac dressing if patient decides not to leave the hospital. Thank-you,  Cammie Mcgeeawn Golda Zavalza MSN, RN, CWOCN, [place] WestWCN-AP, CNS [phone]

## 2016-02-17 NOTE — Consult Note (Addendum)
WOC Nurse wound consult note Reason for Consult:  Vac dressing changed to abd wound, surgical team is following for assessment and plan of care. Wound type: Full thickness post-op, appearance unchanged since previous consult, almost even with skin level. Wound bed: beefy red Drainage (amount, consistency, odor) small amt yellow drainage, no odor Periwound: Intact skin surrounding Dressing procedure/placement/frequency: Applied one piece black foam to 125mm cont suction. Pt tolerated with mod amt discomfort.  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 Ostomy pouching: 1pc.   Extra pouching materials left at the bedside for staff nurse use. Enrolled patient in [facility] [employer] [place] program: Yes Cammie Mcgeeawn Tannon Peerson MSN, RN, [place] HeightsWOCN, [place] ParkWCN-AP, [place] [phone]

## 2016-02-17 NOTE — Progress Notes (Signed)
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  Ongoing  GOAL:   Patient will meet greater than or equal to 90% of their needs  Progressing  MONITOR:   PO intake, Supplement acceptance, Diet advancement, Labs, Skin, I & O's  REASON FOR ASSESSMENT:   Consult Assessment of nutrition requirement/status  ASSESSMENT:   Pt is a 76 year old female. History of multiple rectal prolapses was picking up her 200 lb. Husband when she felt a sharp tear in her rectum with subsequent bleeding.   S/p Procedure(s) 02/11/16: EXPLORATORY LAPAROTOMY , REDUCTION OF EVISERATION ,REPAIR PELVIC FLOOR COLON RESECTION SIGMOID 1/19- NGT d/cCOLOSTOMY  Pt with wound vac to abdominal wound.   Pt remains on clear liquid diet secondary to no colostomy output. Per surgeon notes, plan to advance diet once colostomy output occurs.   Meal completion 50%. Pt is taking Prostat and MVI supplements.   Noted pt has been NPO/clear liquids diet for 6 days. Pt unable to meet nutrition needs related to restriction of current diet. If prolonged NPO/clear liquid diet status is anticipated, may need to consider nutrition support (enteral vs parenteral nutrition).   Labs reviewed: Na: 134 (on IV supplementation).   Diet Order:  Diet clear liquid Room service appropriate? Yes; Fluid consistency: Thin  Skin:  Wound (see comment) (wound vac to abdominal  wound)  Last BM:  02/11/16  Height:   Ht Readings from Last 1 Encounters:  02/11/16 5' 2.5" (1.588 m)    Weight:   Wt Readings from Last 1 Encounters:  02/11/16 115 lb (52.2 kg)    Ideal Body Weight:  51.1 kg  BMI:  Body mass index is 20.7 kg/m.  Estimated Nutritional Needs:   Kcal:  1550-1750  Protein:  75-90 grams  Fluid:  1.5-1.7 L  EDUCATION NEEDS:   Education needs addressed  Nigel Wessman A. Mayford KnifeWilliams, RD, LDN, CDE Pager: [phone] After hours Pager: [phone]

## 2016-02-17 NOTE — Progress Notes (Signed)
Small output noted in ostomy bag this evening

## 2016-02-18 ENCOUNTER — Inpatient Hospital Stay (HOSPITAL_COMMUNITY): Payer: Medicare Other

## 2016-02-18 ENCOUNTER — Encounter (HOSPITAL_COMMUNITY): Payer: Self-pay | Admitting: Radiology

## 2016-02-18 LAB — URINALYSIS, ROUTINE W REFLEX MICROSCOPIC
Glucose, UA: NEGATIVE mg/dL
Hgb urine dipstick: NEGATIVE
Ketones, ur: NEGATIVE mg/dL
Leukocytes, UA: NEGATIVE
NITRITE: NEGATIVE
PROTEIN: NEGATIVE mg/dL
SPECIFIC GRAVITY, URINE: 1.026 (ref 1.005–1.030)
pH: 5 (ref 5.0–8.0)

## 2016-02-18 MED ORDER — IOPAMIDOL (ISOVUE-300) INJECTION 61%
INTRAVENOUS | Status: AC
  Administered 2016-02-18: 100 mL
  Filled 2016-02-18: qty 100

## 2016-02-18 MED ORDER — PANTOPRAZOLE SODIUM 40 MG PO TBEC
40.0000 mg | DELAYED_RELEASE_TABLET | Freq: Every day | ORAL | Status: DC
  Administered 2016-02-18 – 2016-02-22 (×5): 40 mg via ORAL
  Filled 2016-02-18 (×5): qty 1

## 2016-02-18 MED ORDER — IOPAMIDOL (ISOVUE-300) INJECTION 61%
INTRAVENOUS | Status: AC
  Administered 2016-02-18: 11:00:00
  Filled 2016-02-18: qty 30

## 2016-02-18 MED ORDER — POTASSIUM CHLORIDE 2 MEQ/ML IV SOLN
INTRAVENOUS | Status: DC
  Administered 2016-02-18 – 2016-02-19 (×2): via INTRAVENOUS
  Filled 2016-02-18 (×2): qty 1000

## 2016-02-18 MED ORDER — POTASSIUM CHLORIDE 20 MEQ PO PACK
40.0000 meq | PACK | Freq: Two times a day (BID) | ORAL | Status: AC
Start: 2016-02-18 — End: 2016-02-20
  Administered 2016-02-19 – 2016-02-20 (×3): 40 meq via ORAL
  Filled 2016-02-18 (×4): qty 2

## 2016-02-18 NOTE — Progress Notes (Signed)
[facility] Progress Note  7 Days Post-Op  Subjective: Abdominal pain and nausea slightly improved compared to yesterday. Reports that she noticed liquid stool in her ostomy pouch yesterday evening. Denies chills, night sweats, dysuria, productive cough. Is not using IS regularly. Not ambulating regularly and "feels weak".   Fever 102.7 on 1/24, Fever 101.1 today  Objective: Vital signs in last 24 hours: Temp:  [98.3 F (36.8 C)-102.7 F (39.3 C)] 99 F (37.2 C) (01/25 0526) Pulse Rate:  [100-103] 100 (01/25 0526) Resp:  [17] 17 (01/25 0526) BP: (142-177)/(63-80) 142/65 (01/25 0526) SpO2:  [95 %-97 %] 95 % (01/25 0526) Last BM Date: 02/17/16  Intake/Output from previous day: 01/24 0701 - 01/25 0700 In: 1666.3 [P.O.:720; I.V.:946.3] Out: 1126 [Urine:951; Stool:175] Intake/Output this shift: No intake/output data recorded.  PE: Gen: Alert, no acute distress Pulm: CTAB, normal effort, pulling 250 on IS Abd: Soft, globally tender R>L, moderate distention, midline incision with wound vac in place; stoma edematous with liquid stool in pouch, no flatus. Ext: No erythema, edema, or tenderness, pedal pulses 2+ and equal  Lab Results:   Recent Labs  02/17/16 1401  WBC 8.0  HGB 9.9*  HCT 33.9*  PLT 406*   BMET  Recent Labs  02/16/16 0910 02/17/16 1401  NA 134* 134*  K 3.3* 3.2*  CL 99* 99*  CO2 26 27  GLUCOSE 121* 116*  BUN 7 7  CREATININE 0.52 0.45  CALCIUM 8.8* 8.8*   PT/INR No results for input(s): LABPROT, INR in the last 72 hours. CMP     Component Value Date/Time   NA 134 (L) 02/17/2016 1401   K 3.2 (L) 02/17/2016 1401   CL 99 (L) 02/17/2016 1401   CO2 27 02/17/2016 1401   GLUCOSE 116 (H) 02/17/2016 1401   BUN 7 02/17/2016 1401   CREATININE 0.45 02/17/2016 1401   CALCIUM 8.8 (L) 02/17/2016 1401   GFRNONAA >60 02/17/2016 1401   GFRAA >60 02/17/2016 1401   Anti-infectives: Anti-infectives    Start     Dose/Rate Route Frequency  Ordered Stop   02/11/16 1800  cefoTEtan (CEFOTAN) 1 g in dextrose 5 % 50 mL IVPB     1 g 100 mL/hr over 30 Minutes Intravenous Once 02/11/16 0901     02/11/16 0515  cefoTEtan (CEFOTAN) 1 g in dextrose 5 % 50 mL IVPB     1 g 100 mL/hr over 30 Minutes Intravenous  Once 02/11/16 0510 02/11/16 0555       Assessment/Plan s/p Procedure(s): EXPLORATORY LAPAROTOMY , REDUCTION OF EVISERATION ,REPAIR PELVIC FLOOR (N/A) COLON RESECTION SIGMOID (N/A) COLOSTOMY (N/A) 02/11/16 Dr. Lindie SpruceWyatt - POD 7 - WBC WNL - 1/24 return of bowel function, stool in colostomy pouch - VAC changes M/W/F - d/c vac prior to discharge and do wet-to-dry dressing changes (wound is not deep enough to meet VAC criteria at discharge)  - pain control: PO oxy, PO robaxin - Reglan started for nausea 1/24 - Ambulate/IS   Fever - UA, CT Abd/pelv to r/o intra-abdominal abscess   HTN - PRN hydralazine   FEN: Clear liquid diet ID: Cefotetan 1 dose 1/18  Plan: Fever work-up. CT/abd pelvis. If negative may order CXR to r/o PNA. Having stool in colostomy pouch. Advance diet to full liquids then as tolerated   LOS: 7 days    Adam PhenixElizabeth S Espinoza , [facility] 02/18/2016, 8:32 AM Pager: [phone] Consults: [phone] Mon-Fri 7:00 am-4:30 pm Sat-Sun 7:00 am-11:30 am

## 2016-02-18 NOTE — Progress Notes (Addendum)
Brief Nutrition Follow-Up Note  Chart reviewed. Pt now with ostomy output. Diet was advanced from clear liquids to full liquids. Pt currently has 30 ml Prostat TID (which provides total of 300 kcals and 45 grams protein) and MVI ordered. RD will also continue with current supplement regimen, due to documented soy allergy..   Plan for pt to d/c to SNF once medically stable.   RD will continue to follow.   Wendy Espinoza, RD, LDN, CDE Pager: [phone] After hours Pager: [phone]

## 2016-02-18 NOTE — Clinical Social Work Note (Signed)
Pt is able to discharge to [facility] when medically stable. CSW will continue to follow for discharge planning needs.   Dede QuerySarah Angline Schweigert, MSW, LCSW  Clinical Social Worker  [phone]

## 2016-02-18 NOTE — Progress Notes (Signed)
PT Cancellation Note  Patient Details Name: Wendy Espinoza MRN: [id number] DOB: 10/03/1940   Cancelled Treatment:    Reason Eval/Treat Not Completed: Other (comment) (Pt feeling poorly.  Refused.  Will return tomorrow.  Thanks.)   Berline LopesDawn F Adarryl Goldammer 02/18/2016, 1:51 PM Entergy CorporationDawn Shalva Rozycki,PT Acute Rehabilitation [phone] (pager)

## 2016-02-18 NOTE — Progress Notes (Signed)
Pt feeling a lot better this am. Denies pain and nausea. Refused scheduled medication this am.

## 2016-02-18 NOTE — Progress Notes (Signed)
PT Cancellation Note  Patient Details Name: Wendy Espinoza MRN: [id number] DOB: 03/18/1940   Cancelled Treatment:    Reason Eval/Treat Not Completed: Patient at procedure or test/unavailable (Pt in CT. Will return as able.  Thanks. )   Amadeo GarnetDawn F Prem Coykendall 02/18/2016, 10:30 AM  Eber Jonesawn Aspasia Rude,PT Acute Rehabilitation [phone] (pager)

## 2016-02-18 NOTE — Progress Notes (Deleted)
Brief Nutrition Follow-Up Note  Chart reviewed. Pt now with ostomy output. Diet was advanced from clear liquids to full liquids. Pt currently has 30 ml Prostat TID (which provides total of 300 kcals and 45 grams protein) and MVI ordered. RD will also order Unjury po TID, each supplement provides 100 kcal and 20 grams of protein, as pt prefers low sugar items.   Plan for pt to d/c to SNF once medically stable.   RD will continue to follow.   Eriyonna Matsushita A. Mayford KnifeWilliams, RD, LDN, CDE Pager: [phone] After hours Pager: [phone]

## 2016-02-18 NOTE — Care Management Important Message (Signed)
Important Message  Patient Details  Name: Wendy Espinoza MRN: [id number] Date of Birth: 08/27/1940   Medicare Important Message Given:  Yes    Kyla BalzarineShealy, Lyvonne Cassell Abena 02/18/2016, 11:42 AM

## 2016-02-19 ENCOUNTER — Inpatient Hospital Stay (HOSPITAL_COMMUNITY): Payer: Medicare Other

## 2016-02-19 DIAGNOSIS — E039 Hypothyroidism, unspecified: Secondary | ICD-10-CM | POA: Diagnosis present

## 2016-02-19 DIAGNOSIS — E43 Unspecified severe protein-calorie malnutrition: Secondary | ICD-10-CM

## 2016-02-19 DIAGNOSIS — K219 Gastro-esophageal reflux disease without esophagitis: Secondary | ICD-10-CM | POA: Diagnosis present

## 2016-02-19 DIAGNOSIS — Z9889 Other specified postprocedural states: Secondary | ICD-10-CM

## 2016-02-19 DIAGNOSIS — J189 Pneumonia, unspecified organism: Secondary | ICD-10-CM

## 2016-02-19 DIAGNOSIS — Y95 Nosocomial condition: Secondary | ICD-10-CM

## 2016-02-19 DIAGNOSIS — K625 Hemorrhage of anus and rectum: Secondary | ICD-10-CM

## 2016-02-19 LAB — URINE CULTURE

## 2016-02-19 LAB — BASIC METABOLIC PANEL
ANION GAP: 8 (ref 5–15)
ANION GAP: 7 (ref 5–15)
BUN: 18 mg/dL (ref 6–20)
BUN: 15 mg/dL (ref 6–20)
CHLORIDE: 96 mmol/L — AB (ref 101–111)
CO2: 25 mmol/L (ref 22–32)
CO2: 25 mmol/L (ref 22–32)
Calcium: 7.9 mg/dL — ABNORMAL LOW (ref 8.9–10.3)
Calcium: 8 mg/dL — ABNORMAL LOW (ref 8.9–10.3)
Chloride: 96 mmol/L — ABNORMAL LOW (ref 101–111)
Creatinine, Ser: 0.49 mg/dL (ref 0.44–1.00)
Creatinine, Ser: 0.47 mg/dL (ref 0.44–1.00)
GFR calc Af Amer: >60 mL/min (ref >60)
GFR calc Af Amer: >60 mL/min (ref >60)
GFR calc non Af Amer: >60 mL/min (ref >60)
GLUCOSE: 102 mg/dL — AB (ref 65–99)
GLUCOSE: 100 mg/dL — AB (ref 65–99)
POTASSIUM: 3.5 mmol/L (ref 3.5–5.1)
POTASSIUM: 3.4 mmol/L — AB (ref 3.5–5.1)
Sodium: 129 mmol/L — ABNORMAL LOW (ref 135–145)
Sodium: 128 mmol/L — ABNORMAL LOW (ref 135–145)

## 2016-02-19 MED ORDER — VANCOMYCIN HCL 500 MG IV SOLR
500.0000 mg | Freq: Two times a day (BID) | INTRAVENOUS | Status: DC
  Administered 2016-02-19 – 2016-02-21 (×4): 500 mg via INTRAVENOUS
  Filled 2016-02-19 (×6): qty 500

## 2016-02-19 MED ORDER — DEXTROSE 5 % IV SOLN
500.0000 mg | INTRAVENOUS | Status: DC
  Filled 2016-02-19: qty 500

## 2016-02-19 MED ORDER — GUAIFENESIN ER 600 MG PO TB12
600.0000 mg | ORAL_TABLET | Freq: Two times a day (BID) | ORAL | Status: AC
  Administered 2016-02-19 – 2016-02-22 (×6): 600 mg via ORAL
  Filled 2016-02-19 (×6): qty 1

## 2016-02-19 MED ORDER — GUAIFENESIN ER 600 MG PO TB12: 600.0000 mg | ORAL_TABLET | Freq: Two times a day (BID) | ORAL | Status: DC | PRN

## 2016-02-19 MED ORDER — PIPERACILLIN-TAZOBACTAM 3.375 G IVPB 30 MIN
3.3750 g | Freq: Once | INTRAVENOUS | Status: DC
  Filled 2016-02-19: qty 50

## 2016-02-19 MED ORDER — SODIUM CHLORIDE 0.9 % IV SOLN
INTRAVENOUS | Status: AC
  Administered 2016-02-19: 17:00:00 via INTRAVENOUS

## 2016-02-19 MED ORDER — PIPERACILLIN-TAZOBACTAM 3.375 G IVPB 30 MIN
3.3750 g | Freq: Once | INTRAVENOUS | Status: AC
  Administered 2016-02-19: 3.375 g via INTRAVENOUS
  Filled 2016-02-19: qty 50

## 2016-02-19 MED ORDER — PIPERACILLIN-TAZOBACTAM 3.375 G IVPB 30 MIN: 3.3750 g | Freq: Three times a day (TID) | INTRAVENOUS | Status: DC

## 2016-02-19 MED ORDER — IPRATROPIUM-ALBUTEROL 0.5-2.5 (3) MG/3ML IN SOLN: 3.0000 mL | RESPIRATORY_TRACT | Status: DC | PRN

## 2016-02-19 MED ORDER — PIPERACILLIN-TAZOBACTAM 3.375 G IVPB
3.3750 g | Freq: Three times a day (TID) | INTRAVENOUS | Status: DC
  Administered 2016-02-19 – 2016-02-21 (×5): 3.375 g via INTRAVENOUS
  Filled 2016-02-19 (×6): qty 50

## 2016-02-19 NOTE — Progress Notes (Signed)
[facility] Progress Note  8 Days Post-Op  Subjective: Feels much better today. Denies fever, chills, nausea, vomiting. Stool in colostomy pouch. Urinating without hesitancy. Tolerating full liquids. Denies productive cough or SOB.  Objective: Vital signs in last 24 hours: Temp:  [98.2 F (36.8 C)-99.5 F (37.5 C)] 98.6 F (37 C) (01/26 0557) Pulse Rate:  [83-103] 103 (01/26 0557) Resp:  [14] 14 (01/25 1400) BP: (152-178)/(52-64) 178/56 (01/26 0557) SpO2:  [95 %-100 %] 95 % (01/26 0557) Last BM Date: 02/18/16  Intake/Output from previous day: 01/25 0701 - 01/26 0700 In: 1720 [P.O.:480; I.V.:1240] Out: 1350 [Urine:500; Drains:200; Stool:650] Intake/Output this shift: No intake/output data recorded.  PE: Gen: Alert, no acute distress Pulm:  normal effort, bibasilar crackles, pulling 250 on IS Abd: Soft, non-tender, mild distention, midline incision with wet-to-dry in place; stoma edematous with liquid stool in pouch, no flatus. Ext: No erythema, edema, or tenderness, pedal pulses 2+ and equal  Lab Results:   Recent Labs  02/17/16 1401  WBC 8.0  HGB 9.9*  HCT 33.9*  PLT 406*   BMET  Recent Labs  02/17/16 1401  NA 134*  K 3.2*  CL 99*  CO2 27  GLUCOSE 116*  BUN 7  CREATININE 0.45  CALCIUM 8.8*   PT/INR No results for input(s): LABPROT, INR in the last 72 hours. CMP     Component Value Date/Time   NA 134 (L) 02/17/2016 1401   K 3.2 (L) 02/17/2016 1401   CL 99 (L) 02/17/2016 1401   CO2 27 02/17/2016 1401   GLUCOSE 116 (H) 02/17/2016 1401   BUN 7 02/17/2016 1401   CREATININE 0.45 02/17/2016 1401   CALCIUM 8.8 (L) 02/17/2016 1401   GFRNONAA >60 02/17/2016 1401   GFRAA >60 02/17/2016 1401   Lipase  No results found for: LIPASE     Studies/Results: Ct Abdomen Pelvis W Contrast  Result Date: 02/18/2016 CLINICAL DATA:  Low back pain and belching. Fever. Status post laparotomy, sigmoid colon resection and colostomy on 02/11/2016.  EXAM: CT ABDOMEN AND PELVIS WITH CONTRAST TECHNIQUE: Multidetector CT imaging of the abdomen and pelvis was performed using the standard protocol following bolus administration of intravenous contrast. CONTRAST:  100mL ISOVUE-300 IOPAMIDOL (ISOVUE-300) INJECTION 61% COMPARISON:  Lumbar spine MR dated 02/08/2013. FINDINGS: Lower chest: Mild bibasilar atelectasis.  Bilateral breast implants. Hepatobiliary: Minimal diffuse low density of the liver relative to the spleen. The gallbladder is difficult to distinguish from adjacent fluid-filled bowel loops. Pancreas: Mild diffuse pancreatic atrophy with mild dilatation of the pancreatic duct. Spleen: Normal in size without focal abnormality. Adrenals/Urinary Tract: Normal appearing adrenal glands. Poorly distended urinary bladder containing medium density fluid measuring 37 Hounsfield units. Unremarkable kidneys and ureters. Stomach/Bowel: Dilated stomach, including a moderately large hiatal hernia. Diffusely dilated and fluid-filled small bowel and proximal colon. Stool in normal caliber distal colon, extending to the left lower quadrant ostomy. Proximal rectal staple line. Vascular/Lymphatic: Atheromatous arterial calcifications, including the abdominal aorta without aneurysm. No enlarged lymph nodes. Reproductive: Surgically absent uterus.  No adnexal masses. Other: Small amount of anterior abdominal wall postoperative air. Musculoskeletal: Lumbar spine degenerative changes with straightening of the normal lordosis. IMPRESSION: 1. Small bowel and proximal colonic postoperative ileus. 2. Medium density urine in the bladder. This could be due to infection or hematuria. 3. Aortic EF sclerosis. 4. Minimal hepatic steatosis. 5. Moderately large hiatal hernia. 6. Mild diffuse pancreatic atrophy. Electronically Signed   By: Beckie SaltsSteven  Reid M.D.   On: 02/18/2016 17:00  Anti-infectives: Anti-infectives    Start     Dose/Rate Route Frequency Ordered Stop   02/11/16 1800   cefoTEtan (CEFOTAN) 1 g in dextrose 5 % 50 mL IVPB  Status:  Discontinued     1 g 100 mL/hr over 30 Minutes Intravenous Once 02/11/16 0901 02/18/16 1026   02/11/16 0515  cefoTEtan (CEFOTAN) 1 g in dextrose 5 % 50 mL IVPB     1 g 100 mL/hr over 30 Minutes Intravenous  Once 02/11/16 0510 02/11/16 0555     Assessment/Plan s/p Procedure(s): EXPLORATORY LAPAROTOMY , REDUCTION OF EVISERATION ,REPAIR PELVIC FLOOR (N/A) COLON RESECTION SIGMOID (N/A) COLOSTOMY (N/A)02/11/16 Dr. Lindie Spruce - POD 8 - WBC WNL - 1/24 return of bowel function, stool in colostomy pouch - pain control: PO oxy, PO robaxin - Reglan started for nausea 1/24 - Ambulate/IS   Fever - Resolved; UA negative, CT abdomen negative for intra-abdominal fluid collection(s) - suggests ileus HTN - PRN hydralazine  Hypokalemia: 40 mEq KCl BID    FEN: SOFT diet ID: Cefotetan 1 dose 1/18  Plan:advance diet, mobilize with PT  Due to recent fever, bibasilar crackles, and poor volumes on IS - will order portable CXR. Anticipate discharge to SNF tomorrow    LOS: 8 days    Adam Phenix , [facility] 02/19/2016, 9:28 AM Pager: [phone] Consults: [phone] Mon-Fri 7:00 am-4:30 pm Sat-Sun 7:00 am-11:30 am

## 2016-02-19 NOTE — Consult Note (Addendum)
WOC Nurse wound consult note Reason for Consult: Vac dressing ordered to be discontinued to abd wound; surgical team is following for assessment and plan of care. Wound type: Full thickness post-op, appearance unchanged since previous consult, beefy red and almost even with skin level. Drainage (amount, consistency, odor) small amt yellow drainage, no odor Periwound: Intact skin surrounding Dressing procedure/placement/frequency: Applied moist gauze. Pt tolerated with mod amt discomfort.  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  Demonstrated pouch change and pt is able to open and close velcro to empty.  She plans to have assistance after discharge from either [facility] or home health. She denies further questions. Extra pouching materials left at the bedside for staff nurse use. Reviewed ordering supplies after discharge. Enrolled [facility] in [facility] [employer] program: Yes Cammie Mcgeeawn Jasneet Schobert MSN, RN, [place], [place], [place] [phone]

## 2016-02-19 NOTE — Progress Notes (Addendum)
Pharmacy Antibiotic Note  Lenore MannerBarbara K Tumolo is a 76 y.o. female admitted on 02/11/2016 to begin antibiotics for pneumonia.  Pharmacy has been consulted for Vancomycin / Zosyn dosing.  8 days post op --> colostomy  Plan: Zosyn 3.375 grams iv Q 8 hours Vancomycin 500 mg iv Q 12 hours Follow up progress, cultures, Scr  Height: 5' 2.5" (158.8 cm) Weight: 115 lb (52.2 kg) IBW/kg (Calculated) : 51.25  Temp (24hrs), Avg:98.9 F (37.2 C), Min:98.6 F (37 C), Max:99.5 F (37.5 C)   Recent Labs Lab 02/13/16 0521 02/16/16 0910 02/17/16 1401 02/19/16 1144  WBC 11.1*  --  8.0  --   CREATININE 0.56 0.52 0.45 0.49    Estimated Creatinine Clearance: 50.7 mL/min (by C-G formula based on SCr of 0.49 mg/dL).    Allergies  Allergen Reactions  . Gluten Meal Itching, Rash and Other (See Comments)    Severe constipation  . Lactose Intolerance (Gi) Nausea And Vomiting and Other (See Comments)    Severe constipation  . Topamax [Topiramate] Itching and Other (See Comments)    Severe constipation  . Wheat Bran Itching, Rash and Other (See Comments)    Severe constipation  . Gabapentin Itching and Other (See Comments)    constipation  . Lactase Nausea And Vomiting and Other (See Comments)    Severe constipation  . Lyrica [Pregabalin] Other (See Comments)    confusion  . Soy Allergy Other (See Comments)    Flatulence/bloating  . Tagamet [Cimetidine] Nausea And Vomiting  . Tizanidine Other (See Comments)    Possibly bothered stomach  . Tylenol [Acetaminophen] Itching  . Nsaids Itching, Rash and Other (See Comments)    Bruises easily after longterm use    Thank you for allowing pharmacy to be a part of this patient's care. Okey RegalLisa Rossi Silvestro, PharmD [phone] 02/19/2016 3:30 PM

## 2016-02-19 NOTE — Consult Note (Signed)
[facility] Medical Consultation  Wendy Espinoza ZOX:[id number] DOB: [birth year] DOA: 02/11/2016 PCP: No primary care provider on file.   Requesting physician: General surgery service Date of consultation: 02/19/2016 Reason for consultation: Assist in treatment of pneumonia  Impression/Recommendations Principal Problem:   Evisceration of bowel Active Problems:   Protein-calorie malnutrition, severe   HAP (hospital-acquired pneumonia)   PNA - healthcare acquired  Start IV vancomycin and Zosyn, add nebulizer treatments, expectorant prn Monitor fever and WBC's count  All other issues as per admitting team   I will followup again tomorrow. Please contact me if I can be of assistance in the meanwhile. Thank you for this consultation.  Chief Complaint: Abdominal and rectal pain  HPI: 76 year old female with previous history of hypothyroidism, vitamin D deficiency, urinary incontinence, GERD, hypothyroidism who was admitted to [facility] on January 18 with complaints of lower abdominal and rectal pain. Reported that her husband fell down and she was attempting to lift him from the floor when  suddenly she felt something ruptured in the lower abdomen and rectal area followed by severe abdominal pain.  EMS was called and delivered patient to the emergency department where she was found to have a significant amount of small bowel eviscerated through the perineum alongside her rectum. Patient emergently underwent exploratory laparotomy with reduction of evisceration colon resection and creation of sigmoid colostomy.  Patient tolerated procedure well and help colostomy was functioning normal but less than 48 hours in postop period she became febrile. UA was negative for UTI, chest x-ray today revealed prominent right base infiltrate consistent with pneumonia and we were asked to  see patient in consultation to assist in treatment of pneumonia    Review of Systems: Patient denied  chest pain, shortness of breath, cough productive of sputum, nausea or vomiting. She does not have urinary urgency, dysuria hematuria All other review of system is negative  Past Medical History:  Diagnosis Date  . Genital prolapse   . GERD (gastroesophageal reflux disease)   . Hypothyroid   . Kyphosis   . Mononucleosis, infectious, with hepatitis   . Polio   . Rectal prolapse   . Shingles   . Urinary incontinence   . Vitamin D deficiency    Past Surgical History:  Procedure Laterality Date  . ABDOMINAL HYSTERECTOMY    . BACK SURGERY    . COLON RESECTION SIGMOID N/A 02/11/2016   Procedure: COLON RESECTION SIGMOID;  Surgeon: Jimmye Norman, MD;  Location: [facility] OR;  Service: General;  Laterality: N/A;  . COLOSTOMY N/A 02/11/2016   Procedure: COLOSTOMY;  Surgeon: Jimmye Norman, MD;  Location: [facility] OR;  Service: General;  Laterality: N/A;  . LAPAROTOMY N/A 02/11/2016   Procedure: EXPLORATORY LAPAROTOMY , REDUCTION OF EVISERATION ,REPAIR PELVIC FLOOR;  Surgeon: Jimmye Norman, MD;  Location: [facility] OR;  Service: General;  Laterality: N/A;   Social History:  reports that she has never smoked. She has never used smokeless tobacco. She reports that she does not use drugs. Her alcohol history is not on file.  Allergies  Allergen Reactions  . Gluten Meal Itching, Rash and Other (See Comments)    Severe constipation  . Lactose Intolerance (Gi) Nausea And Vomiting and Other (See Comments)    Severe constipation  . Topamax [Topiramate] Itching and Other (See Comments)    Severe constipation  . Wheat Bran Itching, Rash and Other (See Comments)    Severe constipation  . Gabapentin Itching and Other (See Comments)    constipation  .  Lactase Nausea And Vomiting and Other (See Comments)    Severe constipation  . Lyrica [Pregabalin] Other (See Comments)    confusion  . Soy Allergy Other (See Comments)    Flatulence/bloating  . Tagamet [Cimetidine] Nausea And Vomiting  . Tizanidine Other (See Comments)     Possibly bothered stomach  . Tylenol [Acetaminophen] Itching  . Nsaids Itching, Rash and Other (See Comments)    Bruises easily after longterm use   History reviewed. No pertinent family history.  Prior to Admission medications   Medication Sig Start Date End Date Taking? Authorizing Provider  albuterol (PROVENTIL HFA;VENTOLIN HFA) 108 (90 Base) MCG/ACT inhaler Inhale 2 puffs into the lungs every 6 (six) hours as needed for wheezing or shortness of breath.   Yes Historical Provider, MD  Ascorbic Acid (VITAMIN C) 1000 MG tablet Take 1,000 mg by mouth See admin instructions. Take 1 tablet (1000 mg) buffered or timed release - by mouth every morning   Yes Historical Provider, MD  Calcium-Magnesium-Zinc (CAL-MAG-ZINC PO) Take 1 tablet by mouth at bedtime.   Yes Historical Provider, MD  Carisoprodol (SOMA PO) Take 1 tablet by mouth daily as needed (pain).    Yes Historical Provider, MD  cetirizine (ZYRTEC) 10 MG tablet Take 10 mg by mouth daily as needed (seasonal allergies/ exposure to dust).   Yes Historical Provider, MD  clonazePAM (KLONOPIN) 0.5 MG tablet Take 0.5 mg by mouth 2 (two) times daily as needed for anxiety (depression). for anxiety 12/12/15  Yes Historical Provider, MD  diphenoxylate-atropine (LOMOTIL) 2.5-0.025 MG tablet Take by mouth 4 (four) times daily as needed for diarrhea or loose stools.   Yes Historical Provider, MD  DULoxetine (CYMBALTA) 60 MG capsule Take 60 mg by mouth daily. 01/05/16  Yes Historical Provider, MD  fentaNYL (DURAGESIC - DOSED MCG/HR) 50 MCG/HR Place 50 mcg onto the skin every 3 (three) days. 01/13/16  Yes Historical Provider, MD  levothyroxine (SYNTHROID, LEVOTHROID) 100 MCG tablet Take 100 mcg by mouth daily before breakfast. 02/11/16  Yes Historical Provider, MD  MAGNESIUM PO Take 1 tablet by mouth daily.   Yes Historical Provider, MD  meclizine (ANTIVERT) 25 MG tablet Take 25 mg by mouth 3 (three) times daily as needed for nausea.  12/01/15  Yes Historical  Provider, MD  modafinil (PROVIGIL) 200 MG tablet Take 200 mg by mouth daily.  01/05/16  Yes Historical Provider, MD  Multiple Vitamin (MULTIVITAMIN WITH MINERALS) TABS tablet Take 1 tablet by mouth daily. Centrum Silver   Yes Historical Provider, MD  Multiple Vitamins-Minerals (ZINC PO) Take 1 tablet by mouth daily after breakfast.   Yes Historical Provider, MD  NIACIN PO Take 500 mg by mouth See admin instructions. Take 1 tablet (500 mg) buffered or slow release niacin by mouth 3 times daily   Yes Historical Provider, MD  omeprazole (PRILOSEC) 40 MG capsule Take 40 mg by mouth daily. 11/09/15  Yes Historical Provider, MD  oxyCODONE (ROXICODONE) 15 MG immediate release tablet Take 15 mg by mouth daily before lunch.  10/16/15  Yes Historical Provider, MD  Polyethyl Glycol-Propyl Glycol (SYSTANE OP) Place 1 drop into both eyes 3 (three) times daily as needed (dry eyes/ irritation).    Yes Historical Provider, MD  promethazine (PHENERGAN) 25 MG tablet Take 25 mg by mouth every 8 (eight) hours as needed for nausea or vomiting.  11/26/15  Yes Historical Provider, MD  RIBOFLAVIN PO Take 500 mg by mouth 3 (three) times daily.   Yes Historical Provider, MD  rOPINIRole (REQUIP) 2 MG tablet Take 2 mg by mouth See admin instructions. Take 3 tablets (6 mg) by mouth 2 hours before bedtime 12/03/15  Yes Historical Provider, MD  Amino Acids-Protein Hydrolys (FEEDING SUPPLEMENT, PRO-STAT SUGAR FREE 64,) LIQD Take 30 mLs by mouth 3 (three) times daily between meals. 02/16/16   Francine GravenElizabeth S Simaan, PA-C  methocarbamol (ROBAXIN) 500 MG tablet Take 1 tablet (500 mg total) by mouth every 8 (eight) hours as needed for muscle spasms. 02/16/16   Francine GravenElizabeth S Simaan, PA-C  senna (SENOKOT) 8.6 MG TABS tablet Take 1 tablet (8.6 mg total) by mouth daily. 02/16/16   Adam PhenixElizabeth S Simaan, PA-C   Physical Exam: Blood pressure 136/60, pulse 84, temperature 98.6 F (37 C), temperature source Oral, resp. rate 18, height 5' 2.5" (1.588 m),  weight 52.2 kg (115 lb), SpO2 93 %. Vitals:   02/19/16 0557 02/19/16 1329  BP: (!) 178/56 136/60  Pulse: (!) 103 84  Resp:  18  Temp: 98.6 F (37 C) 98.6 F (37 C)     General:  Appears calm and comfortable  Eyes: EOM intact, sclerae anicteric, PERRL  ENT: hearing is intact, dentition is in good condition, mucous membranes moist, intact  Neck: no lymphadenopathy, no massess  Cardiovascular: RRR, no significant murmurs, no LE /pedal edema, no carotid bruits  Respiratory: right basilar crackles, no wheezes, no accessory muscle involvement  Abdomen: slightly distended and mildly tender around incision line, colostomy in place and filled with brounish goren content  Skin: dry, warm to touch, no open sores appreciated  Musculoskeletal: all extremities have full ROM, mild joint deformities of the IP had joints observed  Psychiatric: Alert and oriented x3, affect appropriate  Neurologic: cranial nerves II-XII intact, no focal abnormalities seen, speech fluent  Labs on Admission:   Basic Metabolic Panel:  Recent Labs Lab 02/13/16 0521 02/16/16 0910 02/17/16 1401 02/19/16 1144  NA 133* 134* 134* 129*  K 3.6 3.3* 3.2* 3.5  CL 101 99* 99* 96*  CO2 27 26 27 25   GLUCOSE 103* 121* 116* 102*  BUN 11 7 7 18   CREATININE 0.56 0.52 0.45 0.49  CALCIUM 8.2* 8.8* 8.8* 7.9*   CBC:  Recent Labs Lab 02/12/16 1955 02/13/16 0521 02/17/16 1401  WBC  --  11.1* 8.0  HGB 8.1* 7.4* 9.9*  HCT 27.2* 25.1* 33.9*  MCV  --  65.7* 65.7*  PLT  --  257 406*    Radiological Exams on Admission: Ct Abdomen Pelvis W Contrast  Result Date: 02/18/2016 CLINICAL DATA:  Low back pain and belching. Fever. Status post laparotomy, sigmoid colon resection and colostomy on 02/11/2016. EXAM: CT ABDOMEN AND PELVIS WITH CONTRAST TECHNIQUE: Multidetector CT imaging of the abdomen and pelvis was performed using the standard protocol following bolus administration of intravenous contrast. CONTRAST:  100mL  ISOVUE-300 IOPAMIDOL (ISOVUE-300) INJECTION 61% COMPARISON:  Lumbar spine MR dated 02/08/2013. FINDINGS: Lower chest: Mild bibasilar atelectasis.  Bilateral breast implants. Hepatobiliary: Minimal diffuse low density of the liver relative to the spleen. The gallbladder is difficult to distinguish from adjacent fluid-filled bowel loops. Pancreas: Mild diffuse pancreatic atrophy with mild dilatation of the pancreatic duct. Spleen: Normal in size without focal abnormality. Adrenals/Urinary Tract: Normal appearing adrenal glands. Poorly distended urinary bladder containing medium density fluid measuring 37 Hounsfield units. Unremarkable kidneys and ureters. Stomach/Bowel: Dilated stomach, including a moderately large hiatal hernia. Diffusely dilated and fluid-filled small bowel and proximal colon. Stool in normal caliber distal colon, extending to the left lower quadrant ostomy.  Proximal rectal staple line. Vascular/Lymphatic: Atheromatous arterial calcifications, including the abdominal aorta without aneurysm. No enlarged lymph nodes. Reproductive: Surgically absent uterus.  No adnexal masses. Other: Small amount of anterior abdominal wall postoperative air. Musculoskeletal: Lumbar spine degenerative changes with straightening of the normal lordosis. IMPRESSION: 1. Small bowel and proximal colonic postoperative ileus. 2. Medium density urine in the bladder. This could be due to infection or hematuria. 3. Aortic EF sclerosis. 4. Minimal hepatic steatosis. 5. Moderately large hiatal hernia. 6. Mild diffuse pancreatic atrophy. Electronically Signed   By: Beckie Salts M.D.   On: 02/18/2016 17:00   Dg Chest Port 1 View  Result Date: 02/19/2016 CLINICAL DATA:  Bibasilar crackles. EXAM: PORTABLE CHEST 1 VIEW COMPARISON:  07/14/2015 . FINDINGS: Mediastinum and hilar structures are normal. Prominent right lung base infiltrate noted consistent pneumonia. No pleural effusion or pneumothorax. Cardiomegaly with normal pulmonary  vascularity. Sliding hiatal hernia. IMPRESSION: Prominent right base infiltrate consistent with pneumonia. Electronically Signed   By: Maisie Fus  Register   On: 02/19/2016 13:28    EKG: Independently reviewed. Sinus rhythm with PACs and the LVH    Time spent: 75 minutes  Raymon Mutton [facility] Pager [phone]  If 7PM-7AM, please contact night-coverage [email or web address] Password [email or web address] 02/19/2016, 3:24 PM

## 2016-02-20 DIAGNOSIS — E871 Hypo-osmolality and hyponatremia: Secondary | ICD-10-CM

## 2016-02-20 DIAGNOSIS — J189 Pneumonia, unspecified organism: Secondary | ICD-10-CM

## 2016-02-20 LAB — CBC WITH DIFFERENTIAL/PLATELET
BASOS ABS: 0 10*3/uL (ref 0.0–0.1)
Basophils Relative: 0 %
EOS ABS: 0.1 10*3/uL (ref 0.0–0.7)
Eosinophils Relative: 1 %
HCT: 28.1 % — ABNORMAL LOW (ref 36.0–46.0)
Hemoglobin: 8.2 g/dL — ABNORMAL LOW (ref 12.0–15.0)
LYMPHS PCT: 7 %
Lymphs Abs: 0.7 10*3/uL (ref 0.7–4.0)
MCH: 19 pg — AB (ref 26.0–34.0)
MCHC: 29.2 g/dL — ABNORMAL LOW (ref 30.0–36.0)
MCV: 65 fL — ABNORMAL LOW (ref 78.0–100.0)
Monocytes Absolute: 0.7 10*3/uL (ref 0.1–1.0)
Monocytes Relative: 7 %
NEUTROS ABS: 8.6 10*3/uL — AB (ref 1.7–7.7)
NEUTROS PCT: 85 %
PLATELETS: 489 10*3/uL — AB (ref 150–400)
RBC: 4.32 MIL/uL (ref 3.87–5.11)
RDW: 24.6 % — ABNORMAL HIGH (ref 11.5–15.5)
WBC: 10.1 10*3/uL (ref 4.0–10.5)

## 2016-02-20 LAB — STREP PNEUMONIAE URINARY ANTIGEN: STREP PNEUMO URINARY ANTIGEN: NEGATIVE

## 2016-02-20 MED ORDER — FUROSEMIDE 10 MG/ML IJ SOLN
40.0000 mg | Freq: Once | INTRAMUSCULAR | Status: AC
  Administered 2016-02-20: 40 mg via INTRAVENOUS
  Filled 2016-02-20: qty 4

## 2016-02-20 MED ORDER — POTASSIUM CHLORIDE CRYS ER 20 MEQ PO TBCR: 40.0000 meq | EXTENDED_RELEASE_TABLET | Freq: Every day | ORAL | Status: DC

## 2016-02-20 MED ORDER — OXYCODONE HCL 5 MG PO TABS
5.0000 mg | ORAL_TABLET | ORAL | Status: DC | PRN
  Administered 2016-02-20 – 2016-02-23 (×6): 5 mg via ORAL
  Filled 2016-02-20 (×6): qty 1

## 2016-02-20 MED ORDER — WHITE PETROLATUM GEL
Status: AC
  Administered 2016-02-20: 1
  Filled 2016-02-20: qty 1

## 2016-02-20 NOTE — Progress Notes (Signed)
9 Days Post-Op  Subjective: Alert.  Stable.  Pleasant. Intermittent nausea. Colostomy working well. Currently has full liquid tray.  Says she needs dental soft due to dentures.  That has been ordered. Ambulating more.  Portable chest x-ray shows prominent right base infiltrate consistent with pneumonia On Zosyn and vancomycin for H AP  Objective: Vital signs in last 24 hours: Temp:  [98.6 F (37 C)-100 F (37.8 C)] 100 F (37.8 C) (01/27 0618) Pulse Rate:  [84-92] 87 (01/27 0618) Resp:  [16-18] 16 (01/27 0618) BP: (136-166)/(51-60) 143/51 (01/27 0618) SpO2:  [92 %-93 %] 92 % (01/27 0618) Last BM Date: (P) 02/19/16  Intake/Output from previous day: 01/26 0701 - 01/27 0700 In: 2352.5 [P.O.:1360; I.V.:992.5] Out: 2125 [Urine:1000; Stool:1125] Intake/Output this shift: Total I/O In: -  Out: 500 [Urine:500]  General appearance: Alert.  No distress.  Cooperative. Resp: clear to auscultation bilaterally GI: Abdomen soft and nontender.  Good bowel sounds.  Midline incision packed open and clean.  Fascia intact.  Ostomy pink and healthy.  Liquid stool and gas in ostomy  bag Extremities: no edema, redness or tenderness in the calves or thighs  Lab Results:  Results for orders placed or performed during the hospital encounter of 02/11/16 (from the past 24 hour(s))  Basic metabolic panel     Status: Abnormal   Collection Time: 02/19/16 11:44 AM  Result Value Ref Range   Sodium 129 (L) 135 - 145 mmol/L   Potassium 3.5 3.5 - 5.1 mmol/L   Chloride 96 (L) 101 - 111 mmol/L   CO2 25 22 - 32 mmol/L   Glucose, Bld 102 (H) 65 - 99 mg/dL   BUN 18 6 - 20 mg/dL   Creatinine, Ser 4.090.49 0.44 - 1.00 mg/dL   Calcium 7.9 (L) 8.9 - 10.3 mg/dL   GFR calc non Af Amer >60 >60 mL/min   GFR calc Af Amer >60 >60 mL/min   Anion gap 8 5 - 15  Basic metabolic panel     Status: Abnormal   Collection Time: 02/19/16  4:02 PM  Result Value Ref Range   Sodium 128 (L) 135 - 145 mmol/L   Potassium 3.4 (L)  3.5 - 5.1 mmol/L   Chloride 96 (L) 101 - 111 mmol/L   CO2 25 22 - 32 mmol/L   Glucose, Bld 100 (H) 65 - 99 mg/dL   BUN 15 6 - 20 mg/dL   Creatinine, Ser 8.110.47 0.44 - 1.00 mg/dL   Calcium 8.0 (L) 8.9 - 10.3 mg/dL   GFR calc non Af Amer >60 >60 mL/min   GFR calc Af Amer >60 >60 mL/min   Anion gap 7 5 - 15     Studies/Results: Dg Chest Port 1 View  Result Date: 02/19/2016 CLINICAL DATA:  Bibasilar crackles. EXAM: PORTABLE CHEST 1 VIEW COMPARISON:  07/14/2015 . FINDINGS: Mediastinum and hilar structures are normal. Prominent right lung base infiltrate noted consistent pneumonia. No pleural effusion or pneumothorax. Cardiomegaly with normal pulmonary vascularity. Sliding hiatal hernia. IMPRESSION: Prominent right base infiltrate consistent with pneumonia. Electronically Signed   By: Maisie Fushomas  Register   On: 02/19/2016 13:28    . artificial tears   Both Eyes Daily  . clonazePAM  0.5 mg Oral QHS  . DULoxetine  30 mg Oral Daily  . enoxaparin (LOVENOX) injection  40 mg Subcutaneous Q24H  . feeding supplement (PRO-STAT SUGAR FREE 64)  30 mL Oral TID BM  . fentaNYL  50 mcg Transdermal Q72H  . guaiFENesin  600  mg Oral BID  . levothyroxine  100 mcg Oral QAC breakfast  . methocarbamol  500 mg Oral TID  . metoCLOPramide (REGLAN) injection  5 mg Intravenous Q8H  . modafinil  400 mg Oral Daily  . multivitamin with minerals  1 tablet Oral Daily  . olopatadine  1 drop Both Eyes BID  . ondansetron (ZOFRAN) IV  4 mg Intravenous Q6H  . pantoprazole  40 mg Oral QHS  . piperacillin-tazobactam (ZOSYN)  IV  3.375 g Intravenous Q8H  . potassium chloride  40 mEq Oral BID  . senna  1 tablet Oral Daily  . vancomycin  500 mg Intravenous Q12H     Assessment/Plan: s/p Procedure(s): EXPLORATORY LAPAROTOMY , REDUCTION OF EVISERATION ,REPAIR PELVIC FLOOR COLON RESECTION SIGMOID COLOSTOMY  POD 9 - WBC WNL - 1/24 return of bowel function, stool in colostomy pouch - pain control: PO oxy, PO robaxin - Reglan  started for nausea 1/24 -Overall improving.  We'll try to advance diet today. - Ambulate/IS   PNA with right base infiltrate.  On vancomycin and Zosyn for presumed H AP.  Push ambulation and incentive spirometry.  Fever - Resolved; UA negative, CT abdomen negative for intra-abdominal fluid collection(s) - suggests ileus HTN - PRN hydralazine  Hypokalemia: 40 mEq KCl BID    FEN: Advancing to soft diet this morning. ID: [email or web address] 1 dose 1/18  Plan:advance diet, mobilize with PT  Due to recent fever, bibasilar crackles, and poor volumes on IS - will order portable CXR. Anticipate discharge to SNF 1-2 days  Discussed discharge plan with patient and sister.  Plan discharge to [facility] in 1-2 days if tolerates regular diet.  @PROBHOSP @  LOS: 9 days    Suhayla Chisom M 02/20/2016  . .prob

## 2016-02-20 NOTE — Progress Notes (Signed)
PROGRESS NOTE        PATIENT DETAILS Name: Wendy Espinoza Age: 76 y.o. Sex: female Date of Birth: 1940/04/28 Admit Date: 02/11/2016 Admitting Physician Levie Heritage, DO PCP:No primary care provider on file.  Brief Narrative: Patient is a 76 y.o. female who was admitted by the general surgical service on 1/18 for evisceration of bowel and is now status post laparotomy and colostomy placement, [facility] consulted on 1/26 for management of healthcare associated pneumonia. See below for further details  Subjective: Lying comfortably in bed-does not appear toxic. Only complaint is some cough.  Assessment/Plan: Healthcare associated pneumonia: Afebrile, does not appear toxic-suspect if continues to improve, we could de-escalate antibiotic regimen to either levofloxacin or Augmentin-in the next day or so. No cultures were sent-doubt utility of sending cultures at this point.  Hyponatremia: Acknowledges drinking up to 1 gallon of water while in the hospital-I suspect this is predominantly due to either SIADH or excessive free water use. She is completely euvolemic on exam. Restrict fluids, give one dose of Lasix and recheck electrolytes in the morning. If sodium decreases further than will initiate workup-but currently very mild.  Defer rest of the patient's issues to the primary team.  DVT Prophylaxis: Defer to primary team  Disposition Plan: Defer to primary team  Antimicrobial agents: Anti-infectives    Start     Dose/Rate Route Frequency Ordered Stop   02/19/16 2200  piperacillin-tazobactam (ZOSYN) IVPB 3.375 g     3.375 g 12.5 mL/hr over 240 Minutes Intravenous Every 8 hours 02/19/16 1531     02/19/16 1815  piperacillin-tazobactam (ZOSYN) IVPB 3.375 g     3.375 g 100 mL/hr over 30 Minutes Intravenous  Once 02/19/16 1811 02/19/16 1921   02/19/16 1600  vancomycin (VANCOCIN) 500 mg in sodium chloride 0.9 % 100 mL IVPB     500 mg 100 mL/hr over  60 Minutes Intravenous Every 12 hours 02/19/16 1534     02/19/16 1545  piperacillin-tazobactam (ZOSYN) IVPB 3.375 g  Status:  Discontinued     3.375 g 100 mL/hr over 30 Minutes Intravenous  Once 02/19/16 1531 02/19/16 1811   02/19/16 1530  azithromycin (ZITHROMAX) 500 mg in dextrose 5 % 250 mL IVPB  Status:  Discontinued     500 mg 250 mL/hr over 60 Minutes Intravenous Every 24 hours 02/19/16 1500 02/19/16 1501   02/19/16 1530  piperacillin-tazobactam (ZOSYN) IVPB 3.375 g  Status:  Discontinued     3.375 g 100 mL/hr over 30 Minutes Intravenous Every 8 hours 02/19/16 1527 02/19/16 1531   02/11/16 1800  cefoTEtan (CEFOTAN) 1 g in dextrose 5 % 50 mL IVPB  Status:  Discontinued     1 g 100 mL/hr over 30 Minutes Intravenous Once 02/11/16 0901 02/18/16 1026   02/11/16 0515  cefoTEtan (CEFOTAN) 1 g in dextrose 5 % 50 mL IVPB     1 g 100 mL/hr over 30 Minutes Intravenous  Once 02/11/16 0510 02/11/16 0555      Time spent: 25- minutes-Greater than 50% of this time was spent in counseling, explanation of diagnosis, planning of further management, and coordination of care.  MEDICATIONS: Scheduled Meds: . artificial tears   Both Eyes Daily  . clonazePAM  0.5 mg Oral QHS  . DULoxetine  30 mg Oral Daily  . enoxaparin (LOVENOX) injection  40 mg Subcutaneous Q24H  .  feeding supplement (PRO-STAT SUGAR FREE 64)  30 mL Oral TID BM  . fentaNYL  50 mcg Transdermal Q72H  . guaiFENesin  600 mg Oral BID  . levothyroxine  100 mcg Oral QAC breakfast  . methocarbamol  500 mg Oral TID  . metoCLOPramide (REGLAN) injection  5 mg Intravenous Q8H  . modafinil  400 mg Oral Daily  . multivitamin with minerals  1 tablet Oral Daily  . olopatadine  1 drop Both Eyes BID  . ondansetron (ZOFRAN) IV  4 mg Intravenous Q6H  . pantoprazole  40 mg Oral QHS  . piperacillin-tazobactam (ZOSYN)  IV  3.375 g Intravenous Q8H  . potassium chloride  40 mEq Oral BID  . senna  1 tablet Oral Daily  . vancomycin  500 mg Intravenous  Q12H   Continuous Infusions: . dextrose 5 % and 0.45% NaCl 1,000 mL with potassium chloride 40 mEq infusion 25 mL/hr at 02/19/16 0932   PRN Meds:.[START ON 02/22/2016] guaiFENesin, hydrALAZINE, ipratropium-albuterol, morphine injection, oxyCODONE, simethicone, white petrolatum   PHYSICAL EXAM: Vital signs: Vitals:   02/19/16 0557 02/19/16 1329 02/19/16 2136 02/20/16 0618  BP: (!) 178/56 136/60 (!) 166/51 (!) 143/51  Pulse: (!) 103 84 92 87  Resp:  18 17 16   Temp: 98.6 F (37 C) 98.6 F (37 C) 99.4 F (37.4 C) 100 F (37.8 C)  TempSrc: Oral Oral Oral Oral  SpO2: 95% 93% 92% 92%  Weight:      Height:       Filed Weights   02/11/16 0509  Weight: 52.2 kg (115 lb)   Body mass index is 20.7 kg/m.   General appearance :Awake, alert, not in any distress. Speech Clear.  Eyes:, pupils equally reactive to light and accomodation,no scleral icterus.Pink conjunctiva HEENT: Atraumatic and Normocephalic Neck: supple, no JVD. No cervical lymphadenopathy. No thyromegaly Resp:Good air entry bilaterally, no added sounds  CVS: S1 S2 regular, no murmurs.  GI: Soft-midline dressing present. Colostomy intact. Extremities: B/L Lower Ext shows no edema, both legs are warm to touch Neurology:  speech clear,Non focal, sensation is grossly intact. Psychiatric: Normal judgment and insight. Alert and oriented x 3. Normal mood. Musculoskeletal:No digital cyanosis Skin:No Rash, warm and dry Wounds:N/A  I have personally reviewed following labs and imaging studies  LABORATORY DATA: CBC:  Recent Labs Lab 02/17/16 1401  WBC 8.0  HGB 9.9*  HCT 33.9*  MCV 65.7*  PLT 406*    Basic Metabolic Panel:  Recent Labs Lab 02/16/16 0910 02/17/16 1401 02/19/16 1144 02/19/16 1602  NA 134* 134* 129* 128*  K 3.3* 3.2* 3.5 3.4*  CL 99* 99* 96* 96*  CO2 26 27 25 25   GLUCOSE 121* 116* 102* 100*  BUN 7 7 18 15   CREATININE 0.52 0.45 0.49 0.47  CALCIUM 8.8* 8.8* 7.9* 8.0*    GFR: Estimated  Creatinine Clearance: 50.7 mL/min (by C-G formula based on SCr of 0.47 mg/dL).  Liver Function Tests: No results for input(s): AST, ALT, ALKPHOS, BILITOT, PROT, ALBUMIN in the last 168 hours. No results for input(s): LIPASE, AMYLASE in the last 168 hours. No results for input(s): AMMONIA in the last 168 hours.  Coagulation Profile: No results for input(s): INR, PROTIME in the last 168 hours.  Cardiac Enzymes: No results for input(s): CKTOTAL, CKMB, CKMBINDEX, TROPONINI in the last 168 hours.  BNP (last 3 results) No results for input(s): PROBNP in the last 8760 hours.  HbA1C: No results for input(s): HGBA1C in the last 72 hours.  CBG: No  results for input(s): GLUCAP in the last 168 hours.  Lipid Profile: No results for input(s): CHOL, HDL, LDLCALC, TRIG, CHOLHDL, LDLDIRECT in the last 72 hours.  Thyroid Function Tests: No results for input(s): TSH, T4TOTAL, FREET4, T3FREE, THYROIDAB in the last 72 hours.  Anemia Panel: No results for input(s): VITAMINB12, FOLATE, FERRITIN, TIBC, IRON, RETICCTPCT in the last 72 hours.  Urine analysis:    Component Value Date/Time   COLORURINE YELLOW 02/18/2016 0841   APPEARANCEUR CLEAR 02/18/2016 0841   LABSPEC 1.026 02/18/2016 0841   PHURINE 5.0 02/18/2016 0841   GLUCOSEU NEGATIVE 02/18/2016 0841   HGBUR NEGATIVE 02/18/2016 0841   BILIRUBINUR SMALL (A) 02/18/2016 0841   KETONESUR NEGATIVE 02/18/2016 0841   PROTEINUR NEGATIVE 02/18/2016 0841   NITRITE NEGATIVE 02/18/2016 0841   LEUKOCYTESUR NEGATIVE 02/18/2016 0841    Sepsis Labs: Lactic Acid, Venous No results found for: LATICACIDVEN  MICROBIOLOGY: Recent Results (from the past 240 hour(s))  Culture, Urine     Status: Abnormal   Collection Time: 02/18/16  8:41 AM  Result Value Ref Range Status   Specimen Description URINE, CLEAN CATCH  Final   Special Requests NONE  Final   Culture MULTIPLE SPECIES PRESENT, SUGGEST RECOLLECTION (A)  Final   Report Status 02/19/2016 FINAL   Final    RADIOLOGY STUDIES/RESULTS: Ct Abdomen Pelvis W Contrast  Result Date: 02/18/2016 CLINICAL DATA:  Low back pain and belching. Fever. Status post laparotomy, sigmoid colon resection and colostomy on 02/11/2016. EXAM: CT ABDOMEN AND PELVIS WITH CONTRAST TECHNIQUE: Multidetector CT imaging of the abdomen and pelvis was performed using the standard protocol following bolus administration of intravenous contrast. CONTRAST:  100mL ISOVUE-300 IOPAMIDOL (ISOVUE-300) INJECTION 61% COMPARISON:  Lumbar spine MR dated 02/08/2013. FINDINGS: Lower chest: Mild bibasilar atelectasis.  Bilateral breast implants. Hepatobiliary: Minimal diffuse low density of the liver relative to the spleen. The gallbladder is difficult to distinguish from adjacent fluid-filled bowel loops. Pancreas: Mild diffuse pancreatic atrophy with mild dilatation of the pancreatic duct. Spleen: Normal in size without focal abnormality. Adrenals/Urinary Tract: Normal appearing adrenal glands. Poorly distended urinary bladder containing medium density fluid measuring 37 Hounsfield units. Unremarkable kidneys and ureters. Stomach/Bowel: Dilated stomach, including a moderately large hiatal hernia. Diffusely dilated and fluid-filled small bowel and proximal colon. Stool in normal caliber distal colon, extending to the left lower quadrant ostomy. Proximal rectal staple line. Vascular/Lymphatic: Atheromatous arterial calcifications, including the abdominal aorta without aneurysm. No enlarged lymph nodes. Reproductive: Surgically absent uterus.  No adnexal masses. Other: Small amount of anterior abdominal wall postoperative air. Musculoskeletal: Lumbar spine degenerative changes with straightening of the normal lordosis. IMPRESSION: 1. Small bowel and proximal colonic postoperative ileus. 2. Medium density urine in the bladder. This could be due to infection or hematuria. 3. Aortic EF sclerosis. 4. Minimal hepatic steatosis. 5. Moderately large hiatal  hernia. 6. Mild diffuse pancreatic atrophy. Electronically Signed   By: Beckie SaltsSteven  Reid M.D.   On: 02/18/2016 17:00   Dg Chest Port 1 View  Result Date: 02/19/2016 CLINICAL DATA:  Bibasilar crackles. EXAM: PORTABLE CHEST 1 VIEW COMPARISON:  07/14/2015 . FINDINGS: Mediastinum and hilar structures are normal. Prominent right lung base infiltrate noted consistent pneumonia. No pleural effusion or pneumothorax. Cardiomegaly with normal pulmonary vascularity. Sliding hiatal hernia. IMPRESSION: Prominent right base infiltrate consistent with pneumonia. Electronically Signed   By: Maisie Fushomas  Register   On: 02/19/2016 13:28     LOS: 9 days   Jeoffrey MassedGHIMIRE,SHANKER, MD  [facility] Pager:[phone]  If 7PM-7AM, please contact night-coverage  [email or web address] Password [facility] 02/20/2016, 8:42 AM

## 2016-02-20 NOTE — Progress Notes (Signed)
Physical Therapy Treatment Patient Details Name: Wendy Espinoza MRN: [id number] DOB: 09/16/1940 Today's Date: 02/20/2016    History of Present Illness Patient is a 76 yo female admitted 02/11/16 with eviscerated small bowel through perineum.  Patient s/p reduction of evisceration, colon resection, colostomy, and repair of pelvic floor on 02/11/16.     PMH:  Kyphosis, polio, back surgery, urinary incontinence, back pain, anemia.    PT Comments    Pt agreeable to ambulation this PM. Slow, steady progress with mobility. Goals re-assessed. Original goals remain appropriate.  Follow Up Recommendations  SNF;Supervision/Assistance - 24 hour     Equipment Recommendations  None recommended by PT    Recommendations for Other Services       Precautions / Restrictions Precautions Precautions: Fall Precaution Comments: colostomy    Mobility  Bed Mobility   Bed Mobility: Sit to Sidelying Rolling: Modified independent (Device/Increase time) Sidelying to sit: Supervision     Sit to sidelying: Supervision General bed mobility comments: +rail, increased time to perform at supervision level  Transfers   Equipment used: Ambulation equipment used Transfers: Stand Pivot Transfers Sit to Stand: Min guard Stand pivot transfers: Min guard       General transfer comment: Mildly impulsive. Min guard for safety  Ambulation/Gait   Ambulation Distance (Feet): 150 Feet Assistive device: Rolling walker (2 wheeled) Gait Pattern/deviations: Step-through pattern;Decreased stride length;Trunk flexed Gait velocity: decreased Gait velocity interpretation: Below normal speed for age/gender General Gait Details: Multi standing rest breaks due to mild SOB/fatigue.   Stairs            Wheelchair Mobility    Modified Rankin (Stroke Patients Only)       Balance           Standing balance support: Bilateral upper extremity supported;During functional activity Standing balance-Leahy  Scale: Fair                      Cognition Arousal/Alertness: Awake/alert Behavior During Therapy: WFL for tasks assessed/performed Overall Cognitive Status: Within Functional Limits for tasks assessed                      Exercises      General Comments        Pertinent Vitals/Pain Pain Assessment: 0-10 Pain Score: 3  Pain Descriptors / Indicators: Sore Pain Intervention(s): Monitored during session    Home Living                      Prior Function            PT Goals (current goals can now be found in the care plan section) Acute Rehab PT Goals Patient Stated Goal: To get stronger PT Goal Formulation: With patient Time For Goal Achievement: 03/05/16 Potential to Achieve Goals: Good Progress towards PT goals: Progressing toward goals (Pt progressing slowly. Current goals remain appropriate.)    Frequency    Min 3X/week      PT Plan Current plan remains appropriate    Co-evaluation             End of Session Equipment Utilized During Treatment: Gait belt Activity Tolerance: Patient tolerated treatment well Patient left: in bed;with call bell/phone within reach     Time: 1424-1455 PT Time Calculation (min) (ACUTE ONLY): 31 min  Charges:  $Gait Training: 23-37 mins  G Codes:      Ilda Foil 02/20/2016, 3:19 PM

## 2016-02-20 NOTE — Progress Notes (Signed)
PT Cancellation Note  Patient Details Name: Wendy MannerBarbara K Espinoza MRN: [id number] DOB: 06/27/1940   Cancelled Treatment:    Reason Eval/Treat Not Completed: Fatigue/lethargy limiting ability to participate. Pt reports she had a rough night and morning. She states she is too tired to walk right now. With encouragement to participate, pt continued to decline.   Ilda FoilGarrow, Madell Heino Rene 02/20/2016, 10:42 AM

## 2016-02-21 DIAGNOSIS — E876 Hypokalemia: Secondary | ICD-10-CM

## 2016-02-21 DIAGNOSIS — K439 Ventral hernia without obstruction or gangrene: Secondary | ICD-10-CM

## 2016-02-21 LAB — BASIC METABOLIC PANEL
ANION GAP: 9 (ref 5–15)
ANION GAP: 12 (ref 5–15)
BUN: 10 mg/dL (ref 6–20)
BUN: 9 mg/dL (ref 6–20)
CHLORIDE: 96 mmol/L — AB (ref 101–111)
CHLORIDE: 94 mmol/L — AB (ref 101–111)
CO2: 26 mmol/L (ref 22–32)
CO2: 26 mmol/L (ref 22–32)
Calcium: 7.7 mg/dL — ABNORMAL LOW (ref 8.9–10.3)
Calcium: 8 mg/dL — ABNORMAL LOW (ref 8.9–10.3)
Creatinine, Ser: 0.55 mg/dL (ref 0.44–1.00)
Creatinine, Ser: 0.44 mg/dL (ref 0.44–1.00)
GFR calc Af Amer: >60 mL/min (ref >60)
GFR calc Af Amer: >60 mL/min (ref >60)
GLUCOSE: 96 mg/dL (ref 65–99)
Glucose, Bld: 89 mg/dL (ref 65–99)
POTASSIUM: 2.4 mmol/L — AB (ref 3.5–5.1)
POTASSIUM: 3.1 mmol/L — AB (ref 3.5–5.1)
SODIUM: 132 mmol/L — AB (ref 135–145)
Sodium: 131 mmol/L — ABNORMAL LOW (ref 135–145)

## 2016-02-21 LAB — MAGNESIUM: MAGNESIUM: 1.5 mg/dL — AB (ref 1.7–2.4)

## 2016-02-21 MED ORDER — SODIUM CHLORIDE 0.9 % IV SOLN
30.0000 meq | Freq: Once | INTRAVENOUS | Status: AC
  Administered 2016-02-21: 30 meq via INTRAVENOUS
  Filled 2016-02-21: qty 15

## 2016-02-21 MED ORDER — POTASSIUM CHLORIDE CRYS ER 20 MEQ PO TBCR
40.0000 meq | EXTENDED_RELEASE_TABLET | Freq: Two times a day (BID) | ORAL | Status: DC
  Administered 2016-02-21 – 2016-02-22 (×4): 40 meq via ORAL
  Filled 2016-02-21 (×4): qty 2

## 2016-02-21 MED ORDER — MAGNESIUM SULFATE 2 GM/50ML IV SOLN
2.0000 g | Freq: Once | INTRAVENOUS | Status: AC
  Administered 2016-02-21: 2 g via INTRAVENOUS
  Filled 2016-02-21: qty 50

## 2016-02-21 MED ORDER — AMOXICILLIN-POT CLAVULANATE 875-125 MG PO TABS
1.0000 | ORAL_TABLET | Freq: Two times a day (BID) | ORAL | Status: DC
  Administered 2016-02-21 – 2016-02-23 (×5): 1 via ORAL
  Filled 2016-02-21 (×5): qty 1

## 2016-02-21 MED ORDER — MAGIC MOUTHWASH
15.0000 mL | Freq: Four times a day (QID) | ORAL | Status: DC | PRN
  Administered 2016-02-23 (×2): 15 mL via ORAL
  Filled 2016-02-21: qty 15

## 2016-02-21 NOTE — Progress Notes (Signed)
Please call Gretta CoolBrigette Mottram [phone], sister in law, regarding DC plans.

## 2016-02-21 NOTE — Progress Notes (Signed)
PROGRESS NOTE        PATIENT DETAILS Name: Wendy Espinoza Age: 76 y.o. Sex: female Date of Birth: 01/28/1940 Admit Date: 02/11/2016 Admitting Physician Levie HeritageJacob J Stinson, DO PCP:No primary care provider on file.  Brief Narrative: Patient is a 76 y.o. female who was admitted by the general surgical service on 1/18 for evisceration of bowel and is now status post laparotomy and colostomy placement, [facility] consulted on 1/26 for management of healthcare associated pneumonia. See below for further details  Subjective: Lying comfortably in bed-no major complaints  Assessment/Plan: Healthcare associated pneumonia: Afebrile, does not appear toxic-since clinically improved-stop Vancomycin and Zosyn today-start Augmentin with stop date of 02/25/15. No cultures were sent-doubt utility of sending cultures at this point.  Hyponatremia:Due to excessive free water intake (acknowledges >1 gallon of water intake/day) or SIADH. Remains euvolemic. Na improved after IV lasix x 1 on 1/27. Continue fluid restriction 1500 cc/day.  Hypokalemia: being repleted-recheck in am  Defer rest of the patient's issues to the primary team.  DVT Prophylaxis: Defer to primary team  Disposition Plan: Defer to primary team  Antimicrobial agents: Anti-infectives    Start     Dose/Rate Route Frequency Ordered Stop   02/19/16 2200  piperacillin-tazobactam (ZOSYN) IVPB 3.375 g     3.375 g 12.5 mL/hr over 240 Minutes Intravenous Every 8 hours 02/19/16 1531     02/19/16 1815  piperacillin-tazobactam (ZOSYN) IVPB 3.375 g     3.375 g 100 mL/hr over 30 Minutes Intravenous  Once 02/19/16 1811 02/19/16 1921   02/19/16 1600  vancomycin (VANCOCIN) 500 mg in sodium chloride 0.9 % 100 mL IVPB  Status:  Discontinued     500 mg 100 mL/hr over 60 Minutes Intravenous Every 12 hours 02/19/16 1534 02/21/16 0928   02/19/16 1545  piperacillin-tazobactam (ZOSYN) IVPB 3.375 g  Status:  Discontinued       3.375 g 100 mL/hr over 30 Minutes Intravenous  Once 02/19/16 1531 02/19/16 1811   02/19/16 1530  azithromycin (ZITHROMAX) 500 mg in dextrose 5 % 250 mL IVPB  Status:  Discontinued     500 mg 250 mL/hr over 60 Minutes Intravenous Every 24 hours 02/19/16 1500 02/19/16 1501   02/19/16 1530  piperacillin-tazobactam (ZOSYN) IVPB 3.375 g  Status:  Discontinued     3.375 g 100 mL/hr over 30 Minutes Intravenous Every 8 hours 02/19/16 1527 02/19/16 1531   02/11/16 1800  cefoTEtan (CEFOTAN) 1 g in dextrose 5 % 50 mL IVPB  Status:  Discontinued     1 g 100 mL/hr over 30 Minutes Intravenous Once 02/11/16 0901 02/18/16 1026   02/11/16 0515  cefoTEtan (CEFOTAN) 1 g in dextrose 5 % 50 mL IVPB     1 g 100 mL/hr over 30 Minutes Intravenous  Once 02/11/16 0510 02/11/16 0555      Time spent: 25- minutes-Greater than 50% of this time was spent in counseling, explanation of diagnosis, planning of further management, and coordination of care.  MEDICATIONS: Scheduled Meds: . artificial tears   Both Eyes Daily  . clonazePAM  0.5 mg Oral QHS  . DULoxetine  30 mg Oral Daily  . enoxaparin (LOVENOX) injection  40 mg Subcutaneous Q24H  . feeding supplement (PRO-STAT SUGAR FREE 64)  30 mL Oral TID BM  . fentaNYL  50 mcg Transdermal Q72H  . guaiFENesin  600 mg Oral  BID  . levothyroxine  100 mcg Oral QAC breakfast  . methocarbamol  500 mg Oral TID  . metoCLOPramide (REGLAN) injection  5 mg Intravenous Q8H  . modafinil  400 mg Oral Daily  . multivitamin with minerals  1 tablet Oral Daily  . olopatadine  1 drop Both Eyes BID  . ondansetron (ZOFRAN) IV  4 mg Intravenous Q6H  . pantoprazole  40 mg Oral QHS  . piperacillin-tazobactam (ZOSYN)  IV  3.375 g Intravenous Q8H  . potassium chloride (KCL MULTIRUN) 30 mEq in 265 mL IVPB  30 mEq Intravenous Once  . potassium chloride  40 mEq Oral BID  . senna  1 tablet Oral Daily   Continuous Infusions:  PRN Meds:.hydrALAZINE, ipratropium-albuterol, oxyCODONE,  simethicone, white petrolatum   PHYSICAL EXAM: Vital signs: Vitals:   02/20/16 1108 02/20/16 1359 02/20/16 2117 02/21/16 0507  BP:  (!) 144/46 (!) 153/103 (!) 149/64  Pulse:  85 88 85  Resp:  16 16 18   Temp:  98.4 F (36.9 C) 98.6 F (37 C) 99 F (37.2 C)  TempSrc:  Oral Oral Oral  SpO2:  95% 95% 95%  Weight: 54.4 kg (119 lb 14.9 oz)   56 kg (123 lb 7.3 oz)  Height:       Filed Weights   02/11/16 0509 02/20/16 1108 02/21/16 0507  Weight: 52.2 kg (115 lb) 54.4 kg (119 lb 14.9 oz) 56 kg (123 lb 7.3 oz)   Body mass index is 22.22 kg/m.   General appearance :Awake, alert, not in any distress. Speech Clear.  Eyes:, pupils equally reactive to light and accomodation,no scleral icterus.Pink conjunctiva HEENT: Atraumatic and Normocephalic Neck: supple, no JVD. No cervical lymphadenopathy. No thyromegaly Resp:Good air entry bilaterally, no added sounds  CVS: S1 S2 regular, no murmurs.  GI: Soft-midline dressing present. Colostomy intact. Extremities: B/L Lower Ext shows no edema, both legs are warm to touch Neurology:  speech clear,Non focal, sensation is grossly intact. Psychiatric: Normal judgment and insight. Alert and oriented x 3. Normal mood. Musculoskeletal:No digital cyanosis Skin:No Rash, warm and dry Wounds:N/A  I have personally reviewed following labs and imaging studies  LABORATORY DATA: CBC:  Recent Labs Lab 02/17/16 1401 02/20/16 0703  WBC 8.0 10.1  NEUTROABS  --  8.6*  HGB 9.9* 8.2*  HCT 33.9* 28.1*  MCV 65.7* 65.0*  PLT 406* 489*    Basic Metabolic Panel:  Recent Labs Lab 02/16/16 0910 02/17/16 1401 02/19/16 1144 02/19/16 1602 02/21/16 0533  NA 134* 134* 129* 128* 131*  K 3.3* 3.2* 3.5 3.4* 2.4*  CL 99* 99* 96* 96* 96*  CO2 26 27 25 25 26   GLUCOSE 121* 116* 102* 100* 96  BUN 7 7 18 15 10   CREATININE 0.52 0.45 0.49 0.47 0.55  CALCIUM 8.8* 8.8* 7.9* 8.0* 7.7*    GFR: Estimated Creatinine Clearance: 50.7 mL/min (by C-G formula based on  SCr of 0.55 mg/dL).  Liver Function Tests: No results for input(s): AST, ALT, ALKPHOS, BILITOT, PROT, ALBUMIN in the last 168 hours. No results for input(s): LIPASE, AMYLASE in the last 168 hours. No results for input(s): AMMONIA in the last 168 hours.  Coagulation Profile: No results for input(s): INR, PROTIME in the last 168 hours.  Cardiac Enzymes: No results for input(s): CKTOTAL, CKMB, CKMBINDEX, TROPONINI in the last 168 hours.  BNP (last 3 results) No results for input(s): PROBNP in the last 8760 hours.  HbA1C: No results for input(s): HGBA1C in the last 72 hours.  CBG: No  results for input(s): GLUCAP in the last 168 hours.  Lipid Profile: No results for input(s): CHOL, HDL, LDLCALC, TRIG, CHOLHDL, LDLDIRECT in the last 72 hours.  Thyroid Function Tests: No results for input(s): TSH, T4TOTAL, FREET4, T3FREE, THYROIDAB in the last 72 hours.  Anemia Panel: No results for input(s): VITAMINB12, FOLATE, FERRITIN, TIBC, IRON, RETICCTPCT in the last 72 hours.  Urine analysis:    Component Value Date/Time   COLORURINE YELLOW 02/18/2016 0841   APPEARANCEUR CLEAR 02/18/2016 0841   LABSPEC 1.026 02/18/2016 0841   PHURINE 5.0 02/18/2016 0841   GLUCOSEU NEGATIVE 02/18/2016 0841   HGBUR NEGATIVE 02/18/2016 0841   BILIRUBINUR SMALL (A) 02/18/2016 0841   KETONESUR NEGATIVE 02/18/2016 0841   PROTEINUR NEGATIVE 02/18/2016 0841   NITRITE NEGATIVE 02/18/2016 0841   LEUKOCYTESUR NEGATIVE 02/18/2016 0841    Sepsis Labs: Lactic Acid, Venous No results found for: LATICACIDVEN  MICROBIOLOGY: Recent Results (from the past 240 hour(s))  Culture, Urine     Status: Abnormal   Collection Time: 02/18/16  8:41 AM  Result Value Ref Range Status   Specimen Description URINE, CLEAN CATCH  Final   Special Requests NONE  Final   Culture MULTIPLE SPECIES PRESENT, SUGGEST RECOLLECTION (A)  Final   Report Status 02/19/2016 FINAL  Final    RADIOLOGY STUDIES/RESULTS: Ct Abdomen Pelvis W  Contrast  Result Date: 02/18/2016 CLINICAL DATA:  Low back pain and belching. Fever. Status post laparotomy, sigmoid colon resection and colostomy on 02/11/2016. EXAM: CT ABDOMEN AND PELVIS WITH CONTRAST TECHNIQUE: Multidetector CT imaging of the abdomen and pelvis was performed using the standard protocol following bolus administration of intravenous contrast. CONTRAST:  ISOVUE-300 IOPAMIDOL (ISOVUE-300) INJECTION 61% COMPARISON:  Lumbar spine MR dated 02/08/2013. FINDINGS: Lower chest: Mild bibasilar atelectasis.  Bilateral breast implants. Hepatobiliary: Minimal diffuse low density of the liver relative to the spleen. The gallbladder is difficult to distinguish from adjacent fluid-filled bowel loops. Pancreas: Mild diffuse pancreatic atrophy with mild dilatation of the pancreatic duct. Spleen: Normal in size without focal abnormality. Adrenals/Urinary Tract: Normal appearing adrenal glands. Poorly distended urinary bladder containing medium density fluid measuring 37 Hounsfield units. Unremarkable kidneys and ureters. Stomach/Bowel: Dilated stomach, including a moderately large hiatal hernia. Diffusely dilated and fluid-filled small bowel and proximal colon. Stool in normal caliber distal colon, extending to the left lower quadrant ostomy. Proximal rectal staple line. Vascular/Lymphatic: Atheromatous arterial calcifications, including the abdominal aorta without aneurysm. No enlarged lymph nodes. Reproductive: Surgically absent uterus.  No adnexal masses. Other: Small amount of anterior abdominal wall postoperative air. Musculoskeletal: Lumbar spine degenerative changes with straightening of the normal lordosis. IMPRESSION: 1. Small bowel and proximal colonic postoperative ileus. 2. Medium density urine in the bladder. This could be due to infection or hematuria. 3. Aortic EF sclerosis. 4. Minimal hepatic steatosis. 5. Moderately large hiatal hernia. 6. Mild diffuse pancreatic atrophy. Electronically  Signed   By: Beckie Salts M.D.   On: 02/18/2016 17:00   Dg Chest Port 1 View  Result Date: 02/19/2016 CLINICAL DATA:  Bibasilar crackles. EXAM: PORTABLE CHEST 1 VIEW COMPARISON:  07/14/2015 . FINDINGS: Mediastinum and hilar structures are normal. Prominent right lung base infiltrate noted consistent pneumonia. No pleural effusion or pneumothorax. Cardiomegaly with normal pulmonary vascularity. Sliding hiatal hernia. IMPRESSION: Prominent right base infiltrate consistent with pneumonia. Electronically Signed   By: Maisie Fus  Register   On: 02/19/2016 13:28     LOS: 10 days   Jeoffrey Massed, MD  [facility] Pager:[phone]  If 7PM-7AM, please contact night-coverage  [email or web address] Password [facility] 02/21/2016, 9:28 AM

## 2016-02-21 NOTE — Progress Notes (Signed)
10 Days Post-Op  Subjective: Received a dose of Lasix yesterday.  Brisk diuresis.  Potassium 2.4. Having muscle cramps Potassium replacement initiated  Tolerating diet.  Colostomy functioning without difficulty.  Wound care going well.  Denies shortness of breath or sputum production. Afebrile.  WBC 10,100. She knows that she is on antibiotics for or LL pneumonia.   Objective: Vital signs in last 24 hours: Temp:  [98.4 F (36.9 C)-99 F (37.2 C)] 99 F (37.2 C) (01/28 0507) Pulse Rate:  [85-88] 85 (01/28 0507) Resp:  [16-18] 18 (01/28 0507) BP: (144-153)/(46-103) 149/64 (01/28 0507) SpO2:  [95 %] 95 % (01/28 0507) Weight:  [54.4 kg (119 lb 14.9 oz)-56 kg (123 lb 7.3 oz)] 56 kg (123 lb 7.3 oz) (01/28 0507) Last BM Date: 02/20/16  Intake/Output from previous day: 01/27 0701 - 01/28 0700 In: 1390 [P.O.:740; IV Piggyback:650] Out: 9381 [Urine:4350; Stool:775] Intake/Output this shift: No intake/output data recorded.   EXAM: General appearance: Alert.  Pleasant.  Cooperative.  Mild deconditioning.  Mental status normal. Resp: Perhaps a few faint crackles at right base but otherwise clear GI: GI: Abdomen soft and nontender.  Good bowel sounds.  Midline incision packed open and clean.  Fascia intact.  Ostomy pink and healthy.  Liquid stool and gas in ostomy  bag  Lab Results:  Results for orders placed or performed during the hospital encounter of 02/11/16 (from the past 24 hour(s))  Basic metabolic panel     Status: Abnormal   Collection Time: 02/21/16  5:33 AM  Result Value Ref Range   Sodium 131 (L) 135 - 145 mmol/L   Potassium 2.4 (LL) 3.5 - 5.1 mmol/L   Chloride 96 (L) 101 - 111 mmol/L   CO2 26 22 - 32 mmol/L   Glucose, Bld 96 65 - 99 mg/dL   BUN 10 6 - 20 mg/dL   Creatinine, Ser 0.55 0.44 - 1.00 mg/dL   Calcium 7.7 (L) 8.9 - 10.3 mg/dL   GFR calc non Af Amer >60 >60 mL/min   GFR calc Af Amer >60 >60 mL/min   Anion gap 9 5 - 15     Studies/Results: No results  found.  Marland Kitchen artificial tears   Both Eyes Daily  . clonazePAM  0.5 mg Oral QHS  . DULoxetine  30 mg Oral Daily  . enoxaparin (LOVENOX) injection  40 mg Subcutaneous Q24H  . feeding supplement (PRO-STAT SUGAR FREE 64)  30 mL Oral TID BM  . fentaNYL  50 mcg Transdermal Q72H  . guaiFENesin  600 mg Oral BID  . levothyroxine  100 mcg Oral QAC breakfast  . methocarbamol  500 mg Oral TID  . metoCLOPramide (REGLAN) injection  5 mg Intravenous Q8H  . modafinil  400 mg Oral Daily  . multivitamin with minerals  1 tablet Oral Daily  . olopatadine  1 drop Both Eyes BID  . ondansetron (ZOFRAN) IV  4 mg Intravenous Q6H  . pantoprazole  40 mg Oral QHS  . piperacillin-tazobactam (ZOSYN)  IV  3.375 g Intravenous Q8H  . potassium chloride (KCL MULTIRUN) 30 mEq in 265 mL IVPB  30 mEq Intravenous Once  . potassium chloride  40 mEq Oral BID  . senna  1 tablet Oral Daily  . vancomycin  500 mg Intravenous Q12H     Assessment/Plan: s/p Procedure(s): EXPLORATORY LAPAROTOMY , REDUCTION OF EVISERATION ,REPAIR PELVIC FLOOR COLON RESECTION SIGMOID COLOSTOMY   POD 9 - WBC WNL - 1/24 return of bowel function, stool in colostomy pouch -  pain control: PO oxy, PO robaxin - Reglan started for nausea 1/24 -Overall improving.  - Ambulate/IS   PNA with right base infiltrate.  On vancomycin and Zosyn for presumed H AP.   I agree that this could be switched to oral medication and could be treated as outpatient.   Push ambulation and incentive spirometry.  Hypokalemia.  Secondary to acute diuresis.  Oral and IV replacement underway.  Be met ordered for tomorrow. Fever - Resolved; UA negative, CT abdomen negative for intra-abdominal fluid collection(s) - suggests ileus HTN - PRN hydralazine  Hypokalemia: 40 mEq KCl BID    FEN: tol. Soft diet ID: Cefotetan 1 dose 1/18  Plan:advance diet, mobilize with PT  From a strictly abdominal surgery standpoint she meets all discharge criteria. However, given her  worsening hypokalemia and muscle cramps and right lower lobe pneumonia, I think it is best to wait another day Son is coming from [place] today Hopefully she will stabilize and be ready for transfer to [facility] tomorrow.  Discussed discharge plan with patient and sister.  Plan discharge to [facility] nursing [facility] in 1-2 days if tolerates regular diet.  _0 @  LOS: 10 days    Analys Wendy Espinoza 02/21/2016  . .prob

## 2016-02-21 NOTE — Progress Notes (Addendum)
CRITICAL VALUE ALERT  Critical value received:  K+=2.4  Date of notification:  02-21-2016  Time of notification:  0703  Critical value read back:Yes.    Nurse who received alert:  Hermine MessickJ. Leroy Trim RN  MD notified (1st page):  Dr. Janee Mornhompson  Time of first page:  0703  MD notified (2nd page):NA  Time of second page:NA  Responding MD:  Dr. Janee Mornhompson  Time MD responded:  [id number]

## 2016-02-22 DIAGNOSIS — D638 Anemia in other chronic diseases classified elsewhere: Secondary | ICD-10-CM | POA: Diagnosis present

## 2016-02-22 DIAGNOSIS — M40209 Unspecified kyphosis, site unspecified: Secondary | ICD-10-CM | POA: Diagnosis present

## 2016-02-22 DIAGNOSIS — D62 Acute posthemorrhagic anemia: Secondary | ICD-10-CM | POA: Diagnosis not present

## 2016-02-22 DIAGNOSIS — G894 Chronic pain syndrome: Secondary | ICD-10-CM | POA: Diagnosis present

## 2016-02-22 DIAGNOSIS — G2581 Restless legs syndrome: Secondary | ICD-10-CM | POA: Diagnosis present

## 2016-02-22 LAB — BASIC METABOLIC PANEL
Anion gap: 8 (ref 5–15)
BUN: 6 mg/dL (ref 6–20)
CALCIUM: 7.8 mg/dL — AB (ref 8.9–10.3)
CHLORIDE: 95 mmol/L — AB (ref 101–111)
CO2: 27 mmol/L (ref 22–32)
CREATININE: 0.46 mg/dL (ref 0.44–1.00)
GFR calc Af Amer: >60 mL/min (ref >60)
GFR calc non Af Amer: >60 mL/min (ref >60)
GLUCOSE: 85 mg/dL (ref 65–99)
Potassium: 3.6 mmol/L (ref 3.5–5.1)
Sodium: 130 mmol/L — ABNORMAL LOW (ref 135–145)

## 2016-02-22 MED ORDER — TAB-A-VITE/IRON PO TABS: 1.0000 | ORAL_TABLET | Freq: Every day | ORAL | Status: DC

## 2016-02-22 MED ORDER — OXYCODONE HCL 5 MG PO TABS: 5.0000 mg | ORAL_TABLET | ORAL | 0 refills | Status: DC | PRN

## 2016-02-22 MED ORDER — AMOXICILLIN-POT CLAVULANATE 875-125 MG PO TABS: 1.0000 | ORAL_TABLET | Freq: Two times a day (BID) | ORAL | 0 refills | Status: DC

## 2016-02-22 MED ORDER — OXYCODONE HCL 5 MG PO TABS: 5.0000 mg | ORAL_TABLET | ORAL | 0 refills | Status: AC | PRN

## 2016-02-22 MED ORDER — ADULT MULTIVITAMIN W/MINERALS CH: ORAL_TABLET | ORAL | Status: AC

## 2016-02-22 MED ORDER — MODAFINIL 200 MG PO TABS: 400.0000 mg | ORAL_TABLET | Freq: Every day | ORAL | 0 refills | Status: AC

## 2016-02-22 NOTE — Discharge Summary (Signed)
Physician Discharge Summary  Patient ID: Wendy Espinoza MRN: [id number] DOB/AGE: [birth year] 76 y.o.  Admit date: 02/11/2016 Discharge date: 02/23/2016  Admission Diagnoses:  Eviscerated small bowel through perineum, not rectal prolapse. History of genital and rectal prolapse. History of mononucleosis with infectious hepatitis. History of polio History of GERD Chronic  anemia Kyphosis - chronic pain on Fentanyl and Oxycodone at home. Restless leg syndrome  Discharge Diagnoses:  Rectal perforation with small bowel transrectal evisceration through hole (8cm) in anterior rectal wall(Small bowel evisceration through perineum with abdominal pain) Health care associated pneumonia Hyponatremia - improving Chronic  anemia/postop anemia - transfused 2 units PRBC 02/12/16 Protein-calorie malnutrition, severe Oral candidiasis  - magic mouthwash Kyphosis deformity of spine Chronic pain syndrome - on Fentanyl and Oxycodone chronically. Restless leg syndrome Hypothyroid History of genital and rectal prolapse. History of mononucleosis with infectious hepatitis. 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 Vergia Alberts Course:  Patient was lifting her husband who had fallen down, felt a tear in her lower abdomen and rectal area, developed severe abdominal pain, call EMS to get her.  Upon arrival she was found have have a significant amount of small bowel eviscerated through her  perineum, along side her rectum.  Sever abdominal pain, but the small bowel appeared to be viable.Picture on the H&P shows pink bowel coming from the rectum. She was taken to the operating room and was found to have rectal perforation with of evisceration through an 8 cm hole in the anterior rectal wall. This was repaired, as described in the procedure above. Postop she's had slow but steady progress. Her H/H postop was 6.2/22.2, preoperatively it had been 6.7/24. She was transfused with 2 units of packed cells and hemoglobin came up to 9.9 hematocrit 30 3. She was maintained on clear liquids till her ostomy became functional. 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 Her diet was slowly advanced. She was started on physical therapy. Care nurse has been assisting her with her colostomy care. She has an open wound is undergoing wet-to-dry dressings twice a day. She developed a postop and was seen by Dr.Ghimire. He was initially started on Zosyn and converted to by mouth Augmentin. To have a total 7 days of Augmentin postop. Her GI function slowly coming back to normal. It was her option to go to skilled nursing facility postoperatively and [facility] for [facility]. She's been ambulating with physical therapy, and they recommended skilled nursing facility, with supervision and assistance daily. There were no recommendations for equipment. She was ambulated 150 feet using a rolling walker 2 wheeled.. While nothing by mouth and IV fluids. Hyponatremic. She also required replacement of her potassium and magnesium.  Today she was able to walk 235 feet with a 2 wheeled rolling walker. She is tolerating by mouth's but it's unclear how much she is taking in. Her ostomy is functioning  well. Arrangements were made for her to [facility] nursing facility postoperatively at her request. She was on fentanyl 50 g  patch every 72 hours and oxycodone 15 mg daily at home.  Here we have had her on the Fentanyl patch and prn Oxycodone.    She is followed by:  [facility]  [address]  [address]  [place], [place] [address]  [phone]  Bettina Gavia, MD    I would recommend she use the OxyIR prn for pain now, monitor need and then call Dr. Alton Revere for further instructions on her oxycodone.    Date Type Department Care Team Description  11/26/2015 Telephone [facility] [place]  [address]  [place], [place] [address]  [phone]  [place]    I would contact these providers for follow up after discharge home also.    CBC Latest Ref Rng & Units 02/23/2016 02/20/2016 02/17/2016  WBC 4.0 - 10.5 K/uL 8.2 10.1 8.0  Hemoglobin 12.0 - 15.0 g/dL 8.1(L) 8.2(L) 9.9(L)  Hematocrit 36.0 - 46.0 % 26.8(L) 28.1(L) 33.9(L)  Platelets 150 - 400 K/uL 832(H) 489(H) 406(H)   CMP Latest Ref Rng & Units 02/23/2016 02/22/2016 02/21/2016  Glucose 65 - 99 mg/dL 696(E) 85 89  BUN 6 - 20 mg/dL 6 6 9   Creatinine 0.44 - 1.00 mg/dL 9.52 8.41 3.24  Sodium 135 - 145 mmol/L 131(L) 130(L) 132(L)  Potassium 3.5 - 5.1 mmol/L 4.0 3.6 3.1(L)  Chloride 101 - 111 mmol/L 95(L) 95(L) 94(L)  CO2 22 - 32 mmol/L 28 27 26   Calcium 8.9 - 10.3 mg/dL 8.3(L) 7.8(L) 8.0(L)  Total Protein 6.5 - 8.1 g/dL 5.1(L) - -  Total Bilirubin 0.3 - 1.2 mg/dL 0.4 - -  Alkaline Phos 38 - 126 U/L 57 - -  AST 15 - 41 U/L 25 - -  ALT 14 - 54 U/L 18 - -    Dressing changes:  Wet to dry dressing changes with sterile saline BID   Disposition: 01-Home or Self Care   Condition on D/c Improving  Prior to Admission medications   Medication Sig Start Date End Date Taking? Authorizing Provider  albuterol (PROVENTIL HFA;VENTOLIN HFA) 108 (90 Base) MCG/ACT inhaler Inhale 2 puffs into the lungs every 6 (six) hours as needed for wheezing or shortness of breath.   Yes Historical Provider, MD  Ascorbic  Acid (VITAMIN C) 1000 MG tablet Take 1,000 mg by mouth See admin instructions. Take 1 tablet (1000 mg) buffered or timed release - by mouth every morning   Yes Historical Provider, MD  Calcium-Magnesium-Zinc (CAL-MAG-ZINC PO) Take 1 tablet by mouth at bedtime.   Yes Historical Provider, MD  Carisoprodol (SOMA PO) Take 1 tablet by mouth daily as needed (pain).    Yes Historical Provider, MD  cetirizine (ZYRTEC) 10 MG tablet Take 10 mg by mouth daily as needed (seasonal allergies/ exposure to dust).   Yes Historical Provider, MD  clonazePAM (KLONOPIN) 0.5 MG tablet Take 0.5 mg by mouth 2 (two) times daily as needed for anxiety (depression). for anxiety 12/12/15  Yes Historical Provider, MD  diphenoxylate-atropine (LOMOTIL) 2.5-0.025 MG tablet Take by mouth 4 (four) times daily as needed for diarrhea or loose stools.   Yes Historical Provider, MD  DULoxetine (CYMBALTA) 60 MG capsule Take 60 mg by mouth daily. 01/05/16  Yes Historical Provider, MD  fentaNYL (DURAGESIC - DOSED MCG/HR) 50 MCG/HR Place 50 mcg onto the skin every 3 (three) days. 01/13/16  Yes Historical  Provider, MD  levothyroxine (SYNTHROID, LEVOTHROID) 100 MCG tablet Take 100 mcg by mouth daily before breakfast. 02/11/16  Yes Historical Provider, MD  MAGNESIUM PO Take 1 tablet by mouth daily.   Yes Historical Provider, MD  meclizine (ANTIVERT) 25 MG tablet Take 25 mg by mouth 3 (three) times daily as needed for nausea.  12/01/15  Yes Historical Provider, MD  modafinil (PROVIGIL) 200 MG tablet Take 200 mg by mouth daily.  01/05/16  Yes Historical Provider, MD  Multiple Vitamin (MULTIVITAMIN WITH MINERALS) TABS tablet Take 1 tablet by mouth daily. Centrum Silver   Yes Historical Provider, MD  Multiple Vitamins-Minerals (ZINC PO) Take 1 tablet by mouth daily after breakfast.   Yes Historical Provider, MD  NIACIN PO Take 500 mg by mouth See admin instructions. Take 1 tablet (500 mg) buffered or slow release niacin by mouth 3 times daily   Yes  Historical Provider, MD  omeprazole (PRILOSEC) 40 MG capsule Take 40 mg by mouth daily. 11/09/15  Yes Historical Provider, MD  oxyCODONE (ROXICODONE) 15 MG immediate release tablet Take 15 mg by mouth daily before lunch.  10/16/15  Yes Historical Provider, MD  Polyethyl Glycol-Propyl Glycol (SYSTANE OP) Place 1 drop into both eyes 3 (three) times daily as needed (dry eyes/ irritation).    Yes Historical Provider, MD  promethazine (PHENERGAN) 25 MG tablet Take 25 mg by mouth every 8 (eight) hours as needed for nausea or vomiting.  11/26/15  Yes Historical Provider, MD  RIBOFLAVIN PO Take 500 mg by mouth 3 (three) times daily.   Yes Historical Provider, MD  rOPINIRole (REQUIP) 2 MG tablet Take 2 mg by mouth See admin instructions. Take 3 tablets (6 mg) by mouth 2 hours before bedtime 12/03/15  Yes Historical Provider, MD  Amino Acids-Protein Hydrolys (FEEDING SUPPLEMENT, PRO-STAT SUGAR FREE 64,) LIQD Take 30 mLs by mouth 3 (three) times daily between meals. 02/16/16   Francine Graven Simaan, PA-C  amoxicillin-clavulanate (AUGMENTIN) 875-125 MG tablet Take 1 tablet by mouth every 12 (twelve) hours. 02/22/16   Sherrie George, PA-C  methocarbamol (ROBAXIN) 500 MG tablet Take 1 tablet (500 mg total) by mouth every 8 (eight) hours as needed for muscle spasms. 02/16/16   Francine Graven Simaan, PA-C  modafinil (PROVIGIL) 200 MG tablet Take 2 tablets (400 mg total) by mouth daily. 02/23/16   Sherrie George, PA-C  Multiple Vitamin (MULTIVITAMIN WITH MINERALS) TABS tablet You can get these at any drug store, take 1 daily after lunch. 02/22/16   Sherrie George, PA-C  oxyCODONE (OXY IR/ROXICODONE) 5 MG immediate release tablet Take 1 tablet (5 mg total) by mouth every 4 (four) hours as needed for moderate pain. 02/22/16   Sherrie George, PA-C  senna (SENOKOT) 8.6 MG TABS tablet Take 1 tablet (8.6 mg total) by mouth daily. 02/16/16   Adam Phenix, PA-C     Allergies as of 02/23/2016      Reactions   Gluten Meal  Itching, Rash, Other (See Comments)   Severe constipation   Lactose Intolerance (gi) Nausea And Vomiting, Other (See Comments)   Severe constipation   Topamax [topiramate] Itching, Other (See Comments)   Severe constipation   Wheat Bran Itching, Rash, Other (See Comments)   Severe constipation   Gabapentin Itching, Other (See Comments)   constipation   Lactase Nausea And Vomiting, Other (See Comments)   Severe constipation   Lyrica [pregabalin] Other (See Comments)   confusion   Soy Allergy Other (See Comments)   Flatulence/bloating   Tagamet [  cimetidine] Nausea And Vomiting   Tizanidine Other (See Comments)   Possibly bothered stomach   Tylenol [acetaminophen] Itching   Nsaids Itching, Rash, Other (See Comments)   Bruises easily after longterm use      Medication List    STOP taking these medications   CAL-MAG-ZINC PO   diphenoxylate-atropine 2.5-0.025 MG tablet Commonly known as:  LOMOTIL   MAGNESIUM PO   NIACIN PO   promethazine 25 MG tablet Commonly known as:  PHENERGAN   RIBOFLAVIN PO   SOMA PO   vitamin C 1000 MG tablet   ZINC PO     TAKE these medications   albuterol 108 (90 Base) MCG/ACT inhaler Commonly known as:  PROVENTIL HFA;VENTOLIN HFA Inhale 2 puffs into the lungs every 6 (six) hours as needed for wheezing or shortness of breath.   amoxicillin-clavulanate 875-125 MG tablet Commonly known as:  AUGMENTIN Take 1 tablet by mouth every 12 (twelve) hours.   cetirizine 10 MG tablet Commonly known as:  ZYRTEC Take 10 mg by mouth daily as needed (seasonal allergies/ exposure to dust).   clonazePAM 0.5 MG tablet Commonly known as:  KLONOPIN Take 0.5 mg by mouth 2 (two) times daily as needed for anxiety (depression). for anxiety   DULoxetine 60 MG capsule Commonly known as:  CYMBALTA Take 60 mg by mouth daily.   feeding supplement (PRO-STAT SUGAR FREE 64) Liqd Take 30 mLs by mouth 3 (three) times daily between meals.   fentaNYL 50  MCG/HR Commonly known as:  DURAGESIC - dosed mcg/hr Place 50 mcg onto the skin every 3 (three) days.   ferrous gluconate 324 MG tablet Commonly known as:  FERGON Take 1 tablet (324 mg total) by mouth daily after supper.   levothyroxine 100 MCG tablet Commonly known as:  SYNTHROID, LEVOTHROID Take 100 mcg by mouth daily before breakfast.   magic mouthwash w/lidocaine Soln Take 5 mLs by mouth 4 (four) times daily -  before meals and at bedtime.   meclizine 25 MG tablet Commonly known as:  ANTIVERT Take 25 mg by mouth 3 (three) times daily as needed for nausea.   methocarbamol 500 MG tablet Commonly known as:  ROBAXIN Take 1 tablet (500 mg total) by mouth every 8 (eight) hours as needed for muscle spasms.   modafinil 200 MG tablet Commonly known as:  PROVIGIL Take 2 tablets (400 mg total) by mouth daily. What changed:  medication strength  how much to take  when to take this  Another medication with the same name was removed. Continue taking this medication, and follow the directions you see here.   multivitamin with minerals Tabs tablet You can get these at any drug store, take 1 daily after lunch. What changed:  how much to take  how to take this  when to take this  additional instructions   omeprazole 40 MG capsule Commonly known as:  PRILOSEC Take 40 mg by mouth daily.   oxyCODONE 5 MG immediate release tablet Commonly known as:  Oxy IR/ROXICODONE Take 1 tablet (5 mg total) by mouth every 4 (four) hours as needed for moderate pain. What changed:  medication strength  how much to take  when to take this  reasons to take this   rOPINIRole 2 MG tablet Commonly known as:  REQUIP Take 2 mg by mouth See admin instructions. Take 3 tablets (6 mg) by mouth 2 hours before bedtime   senna 8.6 MG Tabs tablet Commonly known as:  SENOKOT Take 1 tablet (8.6  mg total) by mouth daily.   SYSTANE OP Place 1 drop into both eyes 3 (three) times daily as needed  (dry eyes/ irritation).            Durable Medical Equipment        Start     Ordered   02/14/16 1122  For home use only DME Negative pressure wound device  Once    Question Answer Comment  Frequency of dressing change 3 times per week   Length of need 3 Months   Dressing type Foam   Amount of suction 125 mm/Hg   Pressure application Continuous pressure   Supplies 10 canisters and 15 dressings per month for duration of therapy      02/14/16 1121      Contact information for follow-up providers    Jimmye NormanJAMES WYATT, MD. Go on 03/22/2016.   Specialty:  General Surgery Why:  your appointment is at 10 AM be at the office 30 minutes early for check in. Contact information: [address] [place] [address] [phone]        Iven FinnFERARU,ELAINE R., MD Follow up.   Specialty:  Neurology Why:  Call and discuss chronic pain medicines. Contact information: [address] [place] [address]            Contact information for after-discharge care    Destination    [facility] Follow up.   Specialty:  Skilled Nursing Facility Why:  Call your primary care doctor for follow up after you are discharged from [facility]. Contact information: [address] [place] [id number] [phone]                  Signed: Sherrie GeorgeJENNINGS,Delesia Martinek 02/23/2016, 8:24 AM

## 2016-02-22 NOTE — Progress Notes (Signed)
11 Days Post-Op  Subjective: She is OK, not sure how much she is taking in.  She feels very weak, she fatigues easily with ambulation.  Na still low.  She says she is taking Breeze or shakes, but it's not being recorded.  No SOB  Objective: Vital signs in last 24 hours: Temp:  [99.5 F (37.5 C)-101 F (38.3 C)] 99.6 F (37.6 C) (01/29 0617) Pulse Rate:  [86-91] 87 (01/29 0617) Resp:  [16] 16 (01/29 0617) BP: (146-159)/(55-60) 146/60 (01/29 0617) SpO2:  [94 %-96 %] 96 % (01/29 0617) Weight:  [54.8 kg (120 lb 13 oz)] 54.8 kg (120 lb 13 oz) (01/29 0617) Last BM Date: 02/21/16 220 PO recorded 1700 urine No BM Last recorded temp 1/27, 3 AM NA 130 Last CBC 1/27, was normal  Intake/Output from previous day: 01/28 0701 - 01/29 0700 In: 220 [P.O.:220] Out: 1700 [Urine:1700] Intake/Output this shift: Total I/O In: 120 [P.O.:120] Out: 300 [Urine:300]  General appearance: alert, cooperative, no distress and fatigured Resp: clear to auscultation bilaterally GI: soft, non-tender; bowel sounds normal; no masses,  no organomegaly and Abdominal wound is clean and being dressed wet-to-dry. 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 Extremities: No lower extremity edema.  Lab Results:   Recent Labs  02/20/16 0703  WBC 10.1  HGB 8.2*  HCT 28.1*  PLT 489*    BMET  Recent Labs  02/21/16 1710 02/22/16 0541  NA 132* 130*  K 3.1* 3.6  CL 94* 95*  CO2 26 27  GLUCOSE 89 85  BUN 9 6  CREATININE 0.44 0.46  CALCIUM 8.0* 7.8*   PT/INR No results for input(s): LABPROT, INR in the last 72 hours.  No results for input(s): AST, ALT, ALKPHOS, BILITOT, PROT, ALBUMIN in the last 168 hours.   Lipase  No results found for: LIPASE   Studies/Results: No results found.  Prior to Admission medications   Medication Sig Start Date End Date Taking? Authorizing Provider  albuterol (PROVENTIL HFA;VENTOLIN HFA) 108 (90 Base) MCG/ACT inhaler Inhale 2 puffs into the lungs every 6 (six) hours as needed for  wheezing or shortness of breath.   Yes Historical Provider, MD  Ascorbic Acid (VITAMIN C) 1000 MG tablet Take 1,000 mg by mouth See admin instructions. Take 1 tablet (1000 mg) buffered or timed release - by mouth every morning   Yes Historical Provider, MD  Calcium-Magnesium-Zinc (CAL-MAG-ZINC PO) Take 1 tablet by mouth at bedtime.   Yes Historical Provider, MD  Carisoprodol (SOMA PO) Take 1 tablet by mouth daily as needed (pain).    Yes Historical Provider, MD  cetirizine (ZYRTEC) 10 MG tablet Take 10 mg by mouth daily as needed (seasonal allergies/ exposure to dust).   Yes Historical Provider, MD  clonazePAM (KLONOPIN) 0.5 MG tablet Take 0.5 mg by mouth 2 (two) times daily as needed for anxiety (depression). for anxiety 12/12/15  Yes Historical Provider, MD  diphenoxylate-atropine (LOMOTIL) 2.5-0.025 MG tablet Take by mouth 4 (four) times daily as needed for diarrhea or loose stools.   Yes Historical Provider, MD  DULoxetine (CYMBALTA) 60 MG capsule Take 60 mg by mouth daily. 01/05/16  Yes Historical Provider, MD  fentaNYL (DURAGESIC - DOSED MCG/HR) 50 MCG/HR Place 50 mcg onto the skin every 3 (three) days. 01/13/16  Yes Historical Provider, MD  levothyroxine (SYNTHROID, LEVOTHROID) 100 MCG tablet Take 100 mcg by mouth daily before breakfast. 02/11/16  Yes Historical Provider, MD  MAGNESIUM PO Take 1 tablet by mouth daily.   Yes Historical Provider, MD  meclizine (  ANTIVERT) 25 MG tablet Take 25 mg by mouth 3 (three) times daily as needed for nausea.  12/01/15  Yes Historical Provider, MD  modafinil (PROVIGIL) 200 MG tablet Take 200 mg by mouth daily.  01/05/16  Yes Historical Provider, MD  Multiple Vitamin (MULTIVITAMIN WITH MINERALS) TABS tablet Take 1 tablet by mouth daily. Centrum Silver   Yes Historical Provider, MD  Multiple Vitamins-Minerals (ZINC PO) Take 1 tablet by mouth daily after breakfast.   Yes Historical Provider, MD  NIACIN PO Take 500 mg by mouth See admin instructions. Take 1 tablet  (500 mg) buffered or slow release niacin by mouth 3 times daily   Yes Historical Provider, MD  omeprazole (PRILOSEC) 40 MG capsule Take 40 mg by mouth daily. 11/09/15  Yes Historical Provider, MD  oxyCODONE (ROXICODONE) 15 MG immediate release tablet Take 15 mg by mouth daily before lunch.  10/16/15  Yes Historical Provider, MD  Polyethyl Glycol-Propyl Glycol (SYSTANE OP) Place 1 drop into both eyes 3 (three) times daily as needed (dry eyes/ irritation).    Yes Historical Provider, MD  promethazine (PHENERGAN) 25 MG tablet Take 25 mg by mouth every 8 (eight) hours as needed for nausea or vomiting.  11/26/15  Yes Historical Provider, MD  RIBOFLAVIN PO Take 500 mg by mouth 3 (three) times daily.   Yes Historical Provider, MD  rOPINIRole (REQUIP) 2 MG tablet Take 2 mg by mouth See admin instructions. Take 3 tablets (6 mg) by mouth 2 hours before bedtime 12/03/15  Yes Historical Provider, MD  Amino Acids-Protein Hydrolys (FEEDING SUPPLEMENT, PRO-STAT SUGAR FREE 64,) LIQD Take 30 mLs by mouth 3 (three) times daily between meals. 02/16/16   Francine Graven Simaan, PA-C  methocarbamol (ROBAXIN) 500 MG tablet Take 1 tablet (500 mg total) by mouth every 8 (eight) hours as needed for muscle spasms. 02/16/16   Francine Graven Simaan, PA-C  senna (SENOKOT) 8.6 MG TABS tablet Take 1 tablet (8.6 mg total) by mouth daily. 02/16/16   Adam Phenix, PA-C     Medications: . amoxicillin-clavulanate  1 tablet Oral Q12H  . artificial tears   Both Eyes Daily  . clonazePAM  0.5 mg Oral QHS  . DULoxetine  30 mg Oral Daily  . enoxaparin (LOVENOX) injection  40 mg Subcutaneous Q24H  . feeding supplement (PRO-STAT SUGAR FREE 64)  30 mL Oral TID BM  . fentaNYL  50 mcg Transdermal Q72H  . levothyroxine  100 mcg Oral QAC breakfast  . methocarbamol  500 mg Oral TID  . metoCLOPramide (REGLAN) injection  5 mg Intravenous Q8H  . modafinil  400 mg Oral Daily  . multivitamin with minerals  1 tablet Oral Daily  . olopatadine  1 drop  Both Eyes BID  . ondansetron (ZOFRAN) IV  4 mg Intravenous Q6H  . pantoprazole  40 mg Oral QHS  . potassium chloride  40 mEq Oral BID  . 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 Jimmye Norman Health care associated pneumonia hyponatremia History of genital and rectal prolapse. History of mononucleosis with infectious hepatitis. History of polio History of GERD FEN: soft diet ID: day 2/7 Augmentin 02/21/16 for pneumonia DVT:  Lovenox   Plan: Its 1:30 now so we will plan to keep her tonight. Continue to  mobilize. I'm not sure what she is getting IV looks like nothing on the Epic record. She does have an IV attached to her. I recheck the labs CBC and a chest x-ray in the a.m. if everything is good we'll plan to discharge her to skilled nursing facility tomorrow.       LOS: 11 days    Kalenna Millett 02/22/2016 [phone]

## 2016-02-22 NOTE — Progress Notes (Signed)
PROGRESS NOTE        PATIENT DETAILS Name: Wendy Espinoza Age: 76 y.o. Sex: female Date of Birth: 1940-12-01 Admit Date: 02/11/2016 Admitting Physician Levie Heritage, DO PCP:No primary care provider on file.  Brief Narrative: Patient is a 76 y.o. female who was admitted by the general surgical service on 1/18 for evisceration of bowel and is now status post laparotomy and colostomy placement, [facility] consulted on 1/26 for management of healthcare associated pneumonia. See below for further details  Subjective: Lying comfortably in bed-no major complaints  Assessment/Plan: Healthcare associated pneumonia: One episode of fever last night but continues to look nontoxic. Overall improved. Continue Augmentin with stop date of 02/25/15. No further recommendations apart from close monitoring/observation. If fever reoccurs, would pan culture.   Hyponatremia:Due to excessive free water intake (acknowledges >1 gallon of water intake/day) or SIADH. Remains euvolemic. Na improved after IV lasix x 1 on 1/27. Continue fluid restriction 1500 cc/day.  Hypokalemia: being repleted-recheck in am  Hypomagnesemia: Repleted  Defer rest of the patient's issues to the primary team.  No further recommendations at this time-hospitalist service will sign off.  DVT Prophylaxis: Defer to primary team  Disposition Plan: Defer to primary team  Antimicrobial agents: Anti-infectives    Start     Dose/Rate Route Frequency Ordered Stop   02/21/16 1000  amoxicillin-clavulanate (AUGMENTIN) 875-125 MG per tablet 1 tablet     1 tablet Oral Every 12 hours 02/21/16 0933     02/19/16 2200  piperacillin-tazobactam (ZOSYN) IVPB 3.375 g  Status:  Discontinued     3.375 g 12.5 mL/hr over 240 Minutes Intravenous Every 8 hours 02/19/16 1531 02/21/16 0933   02/19/16 1815  piperacillin-tazobactam (ZOSYN) IVPB 3.375 g     3.375 g 100 mL/hr over 30 Minutes Intravenous  Once 02/19/16  1811 02/19/16 1921   02/19/16 1600  vancomycin (VANCOCIN) 500 mg in sodium chloride 0.9 % 100 mL IVPB  Status:  Discontinued     500 mg 100 mL/hr over 60 Minutes Intravenous Every 12 hours 02/19/16 1534 02/21/16 0928   02/19/16 1545  piperacillin-tazobactam (ZOSYN) IVPB 3.375 g  Status:  Discontinued     3.375 g 100 mL/hr over 30 Minutes Intravenous  Once 02/19/16 1531 02/19/16 1811   02/19/16 1530  azithromycin (ZITHROMAX) 500 mg in dextrose 5 % 250 mL IVPB  Status:  Discontinued     500 mg 250 mL/hr over 60 Minutes Intravenous Every 24 hours 02/19/16 1500 02/19/16 1501   02/19/16 1530  piperacillin-tazobactam (ZOSYN) IVPB 3.375 g  Status:  Discontinued     3.375 g 100 mL/hr over 30 Minutes Intravenous Every 8 hours 02/19/16 1527 02/19/16 1531   02/11/16 1800  cefoTEtan (CEFOTAN) 1 g in dextrose 5 % 50 mL IVPB  Status:  Discontinued     1 g 100 mL/hr over 30 Minutes Intravenous Once 02/11/16 0901 02/18/16 1026   02/11/16 0515  cefoTEtan (CEFOTAN) 1 g in dextrose 5 % 50 mL IVPB     1 g 100 mL/hr over 30 Minutes Intravenous  Once 02/11/16 0510 02/11/16 0555      Time spent: 25- minutes-Greater than 50% of this time was spent in counseling, explanation of diagnosis, planning of further management, and coordination of care.  MEDICATIONS: Scheduled Meds: . amoxicillin-clavulanate  1 tablet Oral Q12H  . artificial tears  Both Eyes Daily  . clonazePAM  0.5 mg Oral QHS  . DULoxetine  30 mg Oral Daily  . enoxaparin (LOVENOX) injection  40 mg Subcutaneous Q24H  . feeding supplement (PRO-STAT SUGAR FREE 64)  30 mL Oral TID BM  . fentaNYL  50 mcg Transdermal Q72H  . guaiFENesin  600 mg Oral BID  . levothyroxine  100 mcg Oral QAC breakfast  . methocarbamol  500 mg Oral TID  . metoCLOPramide (REGLAN) injection  5 mg Intravenous Q8H  . modafinil  400 mg Oral Daily  . multivitamin with minerals  1 tablet Oral Daily  . olopatadine  1 drop Both Eyes BID  . ondansetron (ZOFRAN) IV  4 mg  Intravenous Q6H  . pantoprazole  40 mg Oral QHS  . potassium chloride  40 mEq Oral BID  . senna  1 tablet Oral Daily   Continuous Infusions:  PRN Meds:.hydrALAZINE, ipratropium-albuterol, magic mouthwash, oxyCODONE, simethicone, white petrolatum   PHYSICAL EXAM: Vital signs: Vitals:   02/21/16 1517 02/21/16 2056 02/21/16 2151 02/22/16 0617  BP: (!) 159/55 (!) 158/57  (!) 146/60  Pulse: 86 91  87  Resp: 16 16  16   Temp: 99.5 F (37.5 C) (!) 101 F (38.3 C) 99.5 F (37.5 C) 99.6 F (37.6 C)  TempSrc: Oral Oral Oral Oral  SpO2: 94% 95%  96%  Weight:    54.8 kg (120 lb 13 oz)  Height:       Filed Weights   02/20/16 1108 02/21/16 0507 02/22/16 0617  Weight: 54.4 kg (119 lb 14.9 oz) 56 kg (123 lb 7.3 oz) 54.8 kg (120 lb 13 oz)   Body mass index is 21.74 kg/m.   General appearance :Awake, alert, not in any distress. Speech Clear.  Eyes:, pupils equally reactive to light and accomodation,no scleral icterus.Pink conjunctiva HEENT: Atraumatic and Normocephalic Neck: supple, no JVD. No cervical lymphadenopathy. No thyromegaly Resp:Good air entry bilaterally, no added sounds  CVS: S1 S2 regular, no murmurs.  GI: Soft-midline dressing present. Colostomy intact. Extremities: B/L Lower Ext shows no edema, both legs are warm to touch Neurology:  speech clear,Non focal, sensation is grossly intact. Psychiatric: Normal judgment and insight. Alert and oriented x 3. Normal mood. Musculoskeletal:No digital cyanosis Skin:No Rash, warm and dry Wounds:N/A  I have personally reviewed following labs and imaging studies  LABORATORY DATA: CBC:  Recent Labs Lab 02/17/16 1401 02/20/16 0703  WBC 8.0 10.1  NEUTROABS  --  8.6*  HGB 9.9* 8.2*  HCT 33.9* 28.1*  MCV 65.7* 65.0*  PLT 406* 489*    Basic Metabolic Panel:  Recent Labs Lab 02/19/16 1144 02/19/16 1602 02/21/16 0533 02/21/16 1710 02/22/16 0541  NA 129* 128* 131* 132* 130*  K 3.5 3.4* 2.4* 3.1* 3.6  CL 96* 96* 96* 94*  95*  CO2 25 25 26 26 27   GLUCOSE 102* 100* 96 89 85  BUN 18 15 10 9 6   CREATININE 0.49 0.47 0.55 0.44 0.46  CALCIUM 7.9* 8.0* 7.7* 8.0* 7.8*  MG  --   --   --  1.5*  --     GFR: Estimated Creatinine Clearance: 50.7 mL/min (by C-G formula based on SCr of 0.46 mg/dL).  Liver Function Tests: No results for input(s): AST, ALT, ALKPHOS, BILITOT, PROT, ALBUMIN in the last 168 hours. No results for input(s): LIPASE, AMYLASE in the last 168 hours. No results for input(s): AMMONIA in the last 168 hours.  Coagulation Profile: No results for input(s): INR, PROTIME in the last  168 hours.  Cardiac Enzymes: No results for input(s): CKTOTAL, CKMB, CKMBINDEX, TROPONINI in the last 168 hours.  BNP (last 3 results) No results for input(s): PROBNP in the last 8760 hours.  HbA1C: No results for input(s): HGBA1C in the last 72 hours.  CBG: No results for input(s): GLUCAP in the last 168 hours.  Lipid Profile: No results for input(s): CHOL, HDL, LDLCALC, TRIG, CHOLHDL, LDLDIRECT in the last 72 hours.  Thyroid Function Tests: No results for input(s): TSH, T4TOTAL, FREET4, T3FREE, THYROIDAB in the last 72 hours.  Anemia Panel: No results for input(s): VITAMINB12, FOLATE, FERRITIN, TIBC, IRON, RETICCTPCT in the last 72 hours.  Urine analysis:    Component Value Date/Time   COLORURINE YELLOW 02/18/2016 0841   APPEARANCEUR CLEAR 02/18/2016 0841   LABSPEC 1.026 02/18/2016 0841   PHURINE 5.0 02/18/2016 0841   GLUCOSEU NEGATIVE 02/18/2016 0841   HGBUR NEGATIVE 02/18/2016 0841   BILIRUBINUR SMALL (A) 02/18/2016 0841   KETONESUR NEGATIVE 02/18/2016 0841   PROTEINUR NEGATIVE 02/18/2016 0841   NITRITE NEGATIVE 02/18/2016 0841   LEUKOCYTESUR NEGATIVE 02/18/2016 0841    Sepsis Labs: Lactic Acid, Venous No results found for: LATICACIDVEN  MICROBIOLOGY: Recent Results (from the past 240 hour(s))  Culture, Urine     Status: Abnormal   Collection Time: 02/18/16  8:41 AM  Result Value Ref  Range Status   Specimen Description URINE, CLEAN CATCH  Final   Special Requests NONE  Final   Culture MULTIPLE SPECIES PRESENT, SUGGEST RECOLLECTION (A)  Final   Report Status 02/19/2016 FINAL  Final    RADIOLOGY STUDIES/RESULTS: Ct Abdomen Pelvis W Contrast  Result Date: 02/18/2016 CLINICAL DATA:  Low back pain and belching. Fever. Status post laparotomy, sigmoid colon resection and colostomy on 02/11/2016. EXAM: CT ABDOMEN AND PELVIS WITH CONTRAST TECHNIQUE: Multidetector CT imaging of the abdomen and pelvis was performed using the standard protocol following bolus administration of intravenous contrast. CONTRAST:  ISOVUE-300 IOPAMIDOL (ISOVUE-300) INJECTION 61% COMPARISON:  Lumbar spine MR dated 02/08/2013. FINDINGS: Lower chest: Mild bibasilar atelectasis.  Bilateral breast implants. Hepatobiliary: Minimal diffuse low density of the liver relative to the spleen. The gallbladder is difficult to distinguish from adjacent fluid-filled bowel loops. Pancreas: Mild diffuse pancreatic atrophy with mild dilatation of the pancreatic duct. Spleen: Normal in size without focal abnormality. Adrenals/Urinary Tract: Normal appearing adrenal glands. Poorly distended urinary bladder containing medium density fluid measuring 37 Hounsfield units. Unremarkable kidneys and ureters. Stomach/Bowel: Dilated stomach, including a moderately large hiatal hernia. Diffusely dilated and fluid-filled small bowel and proximal colon. Stool in normal caliber distal colon, extending to the left lower quadrant ostomy. Proximal rectal staple line. Vascular/Lymphatic: Atheromatous arterial calcifications, including the abdominal aorta without aneurysm. No enlarged lymph nodes. Reproductive: Surgically absent uterus.  No adnexal masses. Other: Small amount of anterior abdominal wall postoperative air. Musculoskeletal: Lumbar spine degenerative changes with straightening of the normal lordosis. IMPRESSION: 1. Small bowel and proximal  colonic postoperative ileus. 2. Medium density urine in the bladder. This could be due to infection or hematuria. 3. Aortic EF sclerosis. 4. Minimal hepatic steatosis. 5. Moderately large hiatal hernia. 6. Mild diffuse pancreatic atrophy. Electronically Signed   By: Beckie Salts M.D.   On: 02/18/2016 17:00   Dg Chest Port 1 View  Result Date: 02/19/2016 CLINICAL DATA:  Bibasilar crackles. EXAM: PORTABLE CHEST 1 VIEW COMPARISON:  07/14/2015 . FINDINGS: Mediastinum and hilar structures are normal. Prominent right lung base infiltrate noted consistent pneumonia. No pleural effusion or pneumothorax. Cardiomegaly with normal  pulmonary vascularity. Sliding hiatal hernia. IMPRESSION: Prominent right base infiltrate consistent with pneumonia. Electronically Signed   By: Maisie Fus  Register   On: 02/19/2016 13:28     LOS: 11 days   Jeoffrey Massed, MD  [facility] Pager:[phone]  If 7PM-7AM, please contact night-coverage [email or web address] Password [email or web address] 02/22/2016, 10:03 AM

## 2016-02-22 NOTE — Care Management Important Message (Signed)
Important Message  Patient Details  Name: Wendy MannerBarbara K Nordgren MRN: [id number] Date of Birth: 06/19/1940   Medicare Important Message Given:  Yes    Dorena BodoIris Maram Bently 02/22/2016, 1:58 PM

## 2016-02-22 NOTE — Progress Notes (Signed)
Physical Therapy Treatment Patient Details Name: Wendy MannerBarbara K Dubs MRN: [id number] DOB: 09/21/1940 Today's Date: 02/22/2016    History of Present Illness Patient is a 76 yo female admitted 02/11/16 with eviscerated small bowel through perineum.  Patient s/p reduction of evisceration, colon resection, colostomy, and repair of pelvic floor on 02/11/16.     PMH:  Kyphosis, polio, back surgery, urinary incontinence, back pain, anemia.    PT Comments    Pt admitted with above diagnosis. Pt currently with functional limitations due to the deficits listed below (see PT Problem List). Pt was able to ambulate on unit. Does fatigue and need standing rest breaks and cues for posture. Pt progressing slowly but has had some family issues complicate her stay.  Will continue PT.   Pt will benefit from skilled PT to increase their independence and safety with mobility to allow discharge to the venue listed below.    Follow Up Recommendations  SNF;Supervision/Assistance - 24 hour     Equipment Recommendations  None recommended by PT    Recommendations for Other Services       Precautions / Restrictions Precautions Precautions: Fall Precaution Comments: colostomy Restrictions Weight Bearing Restrictions: No    Mobility  Bed Mobility Overal bed mobility: Needs Assistance Bed Mobility: Sit to Sidelying Rolling: Modified independent (Device/Increase time) Sidelying to sit: Supervision       General bed mobility comments: +rail, increased time to perform at supervision level  Transfers Overall transfer level: Needs assistance Equipment used: Rolling walker (2 wheeled) Transfers: Sit to/from Stand Sit to Stand: Min guard         General transfer comment: Mildly impulsive. Min guard for safety  Ambulation/Gait Ambulation/Gait assistance: Min guard Ambulation Distance (Feet): 235 Feet Assistive device: Rolling walker (2 wheeled) Gait Pattern/deviations: Step-through pattern;Decreased stride  length;Trunk flexed Gait velocity: decreased   General Gait Details: Multi standing rest breaks due to mild SOB/fatigue.  Pt given cues to stand tall as her trunk flexes as she fatigues.     Stairs            Wheelchair Mobility    Modified Rankin (Stroke Patients Only)       Balance Overall balance assessment: Needs assistance Sitting-balance support: No upper extremity supported;Feet supported Sitting balance-Leahy Scale: Fair     Standing balance support: Bilateral upper extremity supported;During functional activity Standing balance-Leahy Scale: Poor Standing balance comment: relies on UE support.                      Cognition Arousal/Alertness: Awake/alert Behavior During Therapy: WFL for tasks assessed/performed Overall Cognitive Status: Within Functional Limits for tasks assessed                      Exercises General Exercises - Lower Extremity Ankle Circles/Pumps: AROM;Both;5 reps;Supine Long Arc Quad: AROM;Both;5 reps;Seated    General Comments General comments (skin integrity, edema, etc.): Pt reports her husband did pass on 1/25 and funeral is soon.  She will be goiing to SNF at [facility] for rehab and then to [place] to stay with son.        Pertinent Vitals/Pain Pain Assessment: 0-10 Pain Score: 4  Pain Location: Abdomen Pain Descriptors / Indicators: Sore Pain Intervention(s): Limited activity within patient's tolerance;Monitored during session;Premedicated before session;Repositioned  VSS    Home Living                      Prior Function  PT Goals (current goals can now be found in the care plan section) Acute Rehab PT Goals Patient Stated Goal: To get stronger Progress towards PT goals: Progressing toward goals    Frequency    Min 3X/week      PT Plan Current plan remains appropriate    Co-evaluation             End of Session Equipment Utilized During Treatment: Gait belt Activity  Tolerance: Patient tolerated treatment well Patient left: with call bell/phone within reach;in chair     Time: 1610-9604 PT Time Calculation (min) (ACUTE ONLY): 19 min  Charges:  $Gait Training: 8-22 mins                    G Codes:      Amadeo Garnet Sona Nations 20-Mar-2016, 1:03 PM [employer] Acute Rehabilitation [phone] (pager)

## 2016-02-23 LAB — COMPREHENSIVE METABOLIC PANEL
ALT: 18 U/L (ref 14–54)
ANION GAP: 8 (ref 5–15)
AST: 25 U/L (ref 15–41)
Albumin: 2 g/dL — ABNORMAL LOW (ref 3.5–5.0)
Alkaline Phosphatase: 57 U/L (ref 38–126)
BILIRUBIN TOTAL: 0.4 mg/dL (ref 0.3–1.2)
BUN: 6 mg/dL (ref 6–20)
CALCIUM: 8.3 mg/dL — AB (ref 8.9–10.3)
CO2: 28 mmol/L (ref 22–32)
CREATININE: 0.44 mg/dL (ref 0.44–1.00)
Chloride: 95 mmol/L — ABNORMAL LOW (ref 101–111)
GFR calc non Af Amer: >60 mL/min (ref >60)
Glucose, Bld: 100 mg/dL — ABNORMAL HIGH (ref 65–99)
Potassium: 4 mmol/L (ref 3.5–5.1)
SODIUM: 131 mmol/L — AB (ref 135–145)
TOTAL PROTEIN: 5.1 g/dL — AB (ref 6.5–8.1)

## 2016-02-23 LAB — CBC
HEMATOCRIT: 26.8 % — AB (ref 36.0–46.0)
HEMOGLOBIN: 8.1 g/dL — AB (ref 12.0–15.0)
MCH: 19.7 pg — AB (ref 26.0–34.0)
MCHC: 30.2 g/dL (ref 30.0–36.0)
MCV: 65.2 fL — AB (ref 78.0–100.0)
Platelets: 832 10*3/uL — ABNORMAL HIGH (ref 150–400)
RBC: 4.11 MIL/uL (ref 3.87–5.11)
RDW: 25.8 % — ABNORMAL HIGH (ref 11.5–15.5)
WBC: 8.2 10*3/uL (ref 4.0–10.5)

## 2016-02-23 MED ORDER — MAGIC MOUTHWASH W/LIDOCAINE: 5.0000 mL | Freq: Three times a day (TID) | ORAL | 0 refills | Status: DC

## 2016-02-23 MED ORDER — FERROUS GLUCONATE 324 (38 FE) MG PO TABS
324.0000 mg | ORAL_TABLET | Freq: Every day | ORAL | Status: DC
  Filled 2016-02-23: qty 1

## 2016-02-23 MED ORDER — MAGIC MOUTHWASH W/LIDOCAINE
5.0000 mL | Freq: Three times a day (TID) | ORAL | Status: DC
  Administered 2016-02-23 (×2): 5 mL via ORAL
  Filled 2016-02-23 (×4): qty 5

## 2016-02-23 MED ORDER — FERROUS GLUCONATE 324 (38 FE) MG PO TABS: 324.0000 mg | ORAL_TABLET | Freq: Every day | ORAL | 3 refills | Status: AC

## 2016-02-23 NOTE — Clinical Social Work Note (Signed)
Pt is ready for discharge today and will go to [facility]. Pt is aware and agreeable to discharge plan. CSW provided support counseling around th recent death of her husband during this admission. CSW updated pt's sister in law, who will provide transportation. RN has called report. CSW is signing off as no further needs identified.   Dede QuerySarah Seydou Hearns, MSW, LCSW  Clinical Social Worker  [phone]

## 2016-02-23 NOTE — Progress Notes (Signed)
Report given to Ellin Mayhewkelly Adams , RN at [facility].

## 2016-02-23 NOTE — Progress Notes (Signed)
12 Days Post-Op  Subjective: Over all she looks good.  Her ostomy is working, no complaints of significant pain.  She seems to be at her baseline. Her throat is sore and its one reason she likes the liquids most.  Her tongue is red and appears to have some candida  Open wound looks good.    Objective: Vital signs in last 24 hours: Temp:  [98.2 F (36.8 C)-99.8 F (37.7 C)] 98.2 F (36.8 C) (01/30 0456) Pulse Rate:  [82-92] 82 (01/30 0456) Resp:  [17-18] 17 (01/30 0456) BP: (144-150)/(56-70) 150/70 (01/30 0456) SpO2:  [95 %-98 %] 96 % (01/30 0456) Weight:  [53.3 kg (117 lb 8.1 oz)] 53.3 kg (117 lb 8.1 oz) (01/30 0500) Last BM Date: 02/22/16 675 PO recorded Urine 1250 TM 99.8, VSS Na 131,  H/H stable at 8.1/26.8  Intake/Output from previous day: 01/29 0701 - 01/30 0700 In: 675 [P.O.:675] Out: 1250 [Urine:1250] Intake/Output this shift: No intake/output data recorded.  General appearance: alert, cooperative and no distress Throat: toungue is red and has some white exudate. Resp: clear to auscultation bilaterally GI: soft, non-tender; bowel sounds normal; no masses,  no organomegaly and open wound looks good, some ongoing drainage on the dressing, but clean and red.    Lab Results:   Recent Labs  02/23/16 0551  WBC 8.2  HGB 8.1*  HCT 26.8*  PLT 832*    BMET  Recent Labs  02/21/16 1710 02/22/16 0541  NA 132* 130*  K 3.1* 3.6  CL 94* 95*  CO2 26 27  GLUCOSE 89 85  BUN 9 6  CREATININE 0.44 0.46  CALCIUM 8.0* 7.8*   PT/INR No results for input(s): LABPROT, INR in the last 72 hours.  No results for input(s): AST, ALT, ALKPHOS, BILITOT, PROT, ALBUMIN in the last 168 hours.   Lipase  No results found for: LIPASE   Studies/Results: No results found. Prior to Admission medications   Medication Sig Start Date End Date Taking? Authorizing Provider  albuterol (PROVENTIL HFA;VENTOLIN HFA) 108 (90 Base) MCG/ACT inhaler Inhale 2 puffs into the lungs every 6  (six) hours as needed for wheezing or shortness of breath.   Yes Historical Provider, MD  Ascorbic Acid (VITAMIN C) 1000 MG tablet Take 1,000 mg by mouth See admin instructions. Take 1 tablet (1000 mg) buffered or timed release - by mouth every morning   Yes Historical Provider, MD  Calcium-Magnesium-Zinc (CAL-MAG-ZINC PO) Take 1 tablet by mouth at bedtime.   Yes Historical Provider, MD  Carisoprodol (SOMA PO) Take 1 tablet by mouth daily as needed (pain).    Yes Historical Provider, MD  cetirizine (ZYRTEC) 10 MG tablet Take 10 mg by mouth daily as needed (seasonal allergies/ exposure to dust).   Yes Historical Provider, MD  clonazePAM (KLONOPIN) 0.5 MG tablet Take 0.5 mg by mouth 2 (two) times daily as needed for anxiety (depression). for anxiety 12/12/15  Yes Historical Provider, MD  diphenoxylate-atropine (LOMOTIL) 2.5-0.025 MG tablet Take by mouth 4 (four) times daily as needed for diarrhea or loose stools.   Yes Historical Provider, MD  DULoxetine (CYMBALTA) 60 MG capsule Take 60 mg by mouth daily. 01/05/16  Yes Historical Provider, MD  fentaNYL (DURAGESIC - DOSED MCG/HR) 50 MCG/HR Place 50 mcg onto the skin every 3 (three) days. 01/13/16  Yes Historical Provider, MD  levothyroxine (SYNTHROID, LEVOTHROID) 100 MCG tablet Take 100 mcg by mouth daily before breakfast. 02/11/16  Yes Historical Provider, MD  MAGNESIUM PO Take 1 tablet by  mouth daily.   Yes Historical Provider, MD  meclizine (ANTIVERT) 25 MG tablet Take 25 mg by mouth 3 (three) times daily as needed for nausea.  12/01/15  Yes Historical Provider, MD  modafinil (PROVIGIL) 200 MG tablet Take 200 mg by mouth daily.  01/05/16  Yes Historical Provider, MD  Multiple Vitamin (MULTIVITAMIN WITH MINERALS) TABS tablet Take 1 tablet by mouth daily. Centrum Silver   Yes Historical Provider, MD  Multiple Vitamins-Minerals (ZINC PO) Take 1 tablet by mouth daily after breakfast.   Yes Historical Provider, MD  NIACIN PO Take 500 mg by mouth See admin  instructions. Take 1 tablet (500 mg) buffered or slow release niacin by mouth 3 times daily   Yes Historical Provider, MD  omeprazole (PRILOSEC) 40 MG capsule Take 40 mg by mouth daily. 11/09/15  Yes Historical Provider, MD  oxyCODONE (ROXICODONE) 15 MG immediate release tablet Take 15 mg by mouth daily before lunch.  10/16/15  Yes Historical Provider, MD  Polyethyl Glycol-Propyl Glycol (SYSTANE OP) Place 1 drop into both eyes 3 (three) times daily as needed (dry eyes/ irritation).    Yes Historical Provider, MD  promethazine (PHENERGAN) 25 MG tablet Take 25 mg by mouth every 8 (eight) hours as needed for nausea or vomiting.  11/26/15  Yes Historical Provider, MD  RIBOFLAVIN PO Take 500 mg by mouth 3 (three) times daily.   Yes Historical Provider, MD  rOPINIRole (REQUIP) 2 MG tablet Take 2 mg by mouth See admin instructions. Take 3 tablets (6 mg) by mouth 2 hours before bedtime 12/03/15  Yes Historical Provider, MD  Amino Acids-Protein Hydrolys (FEEDING SUPPLEMENT, PRO-STAT SUGAR FREE 64,) LIQD Take 30 mLs by mouth 3 (three) times daily between meals. 02/16/16   Francine Graven Simaan, PA-C  amoxicillin-clavulanate (AUGMENTIN) 875-125 MG tablet Take 1 tablet by mouth every 12 (twelve) hours. 02/22/16   Sherrie George, PA-C  methocarbamol (ROBAXIN) 500 MG tablet Take 1 tablet (500 mg total) by mouth every 8 (eight) hours as needed for muscle spasms. 02/16/16   Francine Graven Simaan, PA-C  modafinil (PROVIGIL) 200 MG tablet Take 2 tablets (400 mg total) by mouth daily. 02/23/16   Sherrie George, PA-C  Multiple Vitamin (MULTIVITAMIN WITH MINERALS) TABS tablet You can get these at any drug store, take 1 daily after lunch. 02/22/16   Sherrie George, PA-C  oxyCODONE (OXY IR/ROXICODONE) 5 MG immediate release tablet Take 1 tablet (5 mg total) by mouth every 4 (four) hours as needed for moderate pain. 02/22/16   Sherrie George, PA-C  senna (SENOKOT) 8.6 MG TABS tablet Take 1 tablet (8.6 mg total) by mouth daily.  02/16/16   Adam Phenix, PA-C    Medications: . amoxicillin-clavulanate  1 tablet Oral Q12H  . artificial tears   Both Eyes Daily  . clonazePAM  0.5 mg Oral QHS  . DULoxetine  30 mg Oral Daily  . enoxaparin (LOVENOX) injection  40 mg Subcutaneous Q24H  . feeding supplement (PRO-STAT SUGAR FREE 64)  30 mL Oral TID BM  . fentaNYL  50 mcg Transdermal Q72H  . levothyroxine  100 mcg Oral QAC breakfast  . methocarbamol  500 mg Oral TID  . metoCLOPramide (REGLAN) injection  5 mg Intravenous Q8H  . modafinil  400 mg Oral Daily  . multivitamin with minerals  1 tablet Oral Daily  . olopatadine  1 drop Both Eyes BID  . ondansetron (ZOFRAN) IV  4 mg Intravenous Q6H  . pantoprazole  40 mg Oral QHS  . potassium  chloride  40 mEq Oral BID  . senna  1 tablet Oral Daily   No IV fluids   History of genital and rectal prolapse. History of mononucleosis with infectious hepatitis. History of polio History of GERD  Assessment/Plan Rectal perforation with small bowel transrectal evisceration through hole (8cm) in anterior rectal wall (Small bowel evisceration through perineum with abdominal pain) Status post exploratory laparotomy, reduction of evisceration, sigmoid colon resection, colostomy 02/11/16 Dr. Jimmye Norman [facility] care associated pneumonia  Anemia - transfused 02/12/16 - Fe gluconate and MVI with FE Hyponatremia Protein-calorie malnutrition, severe Oral candidiasis  - magic mouthwash Kyphosis deformity of spine Chronic pain syndrome - on Fentanyl and Oxycodone chronically. Restless leg syndrome Hypothyroid FEN: soft diet ID: day 3/7 Augmentin 02/21/16 for pneumonia DVT:  Lovenox  Plan:  Labs are fine, I am going to let her go to SNF and follow up with her primary care for her anemia.  Neurologist for her chronic pain and Dr. Lindie Spruce will see her in the office 03/22/16 at 10 AM.    LOS: 12 days    JENNINGS,WILLARD 02/23/2016 [phone]  Agree with above. Ostomy functioning  and wound okay. Doing well from odd injury.  She is going to [facility] today.  He husband died from pancreatic cancer while she was hospitalized.  Ovidio Kin, MD, [facility] Surgery Pager: [phone] Office phone:  [phone]   Ovidio Kin, MD, [facility] Pager: [phone] Office phone:  [phone]

## 2016-02-23 NOTE — Progress Notes (Signed)
Discharge to [facility], transported by Sister in Social workerlaw. Patient was alert and oriented, not in any distress. Dressing to abdomen changed prior to discharge. Colostomy change as well. Colostomy supplies given to patient as well.

## 2016-02-23 NOTE — Clinical Social Work Placement (Signed)
   CLINICAL SOCIAL WORK PLACEMENT  NOTE  Date:  02/23/2016  Patient Details  Name: Wendy Espinoza MRN: [id number] Date of Birth: 02/18/1940  Clinical Social Work is seeking post-discharge placement for this patient at the Skilled  Nursing Facility level of care (*CSW will initial, date and re-position this form in  chart as items are completed):  Yes   Patient/family provided with [facility] Social Work Department's list of facilities offering this level of care within the geographic area requested by the patient (or if unable, by the patient's family).  Yes   Patient/family informed of their freedom to choose among providers that offer the needed level of care, that participate in Medicare, Medicaid or managed care program needed by the patient, have an available bed and are willing to accept the patient.  Yes   Patient/family informed of [facility]'s ownership interest in [facility] and [facility], as well as of the fact that they are under no obligation to receive care at these facilities.  PASRR submitted to EDS on 02/14/16     PASRR number received on 02/14/16     Existing PASRR number confirmed on       FL2 transmitted to all facilities in geographic area requested by pt/family on 02/14/16     FL2 transmitted to all facilities within larger geographic area on       Patient informed that his/her managed care company has contracts with or will negotiate with certain facilities, including the following:        Yes   Patient/family informed of bed offers received.  Patient chooses bed at [facility] [place]     Physician recommends and patient chooses bed at      Patient to be transferred to [facility] [place] on 02/23/16.  Patient to be transferred to facility by Pt's sister in law     Patient family notified on 02/23/16 of transfer.  Name of family member notified:  Pt's sister in law     PHYSICIAN Please prepare priority discharge  summary, including medications, Please prepare prescriptions, Please sign FL2     Additional Comment:    _______________________________________________ Wendy QuerySarah Hikari Tripp, LCSW 02/23/2016, 12:47 PM

## 2016-04-19 ENCOUNTER — Encounter (INDEPENDENT_AMBULATORY_CARE_PROVIDER_SITE_OTHER): Payer: Self-pay | Admitting: Physical Medicine and Rehabilitation

## 2016-04-19 ENCOUNTER — Ambulatory Visit (INDEPENDENT_AMBULATORY_CARE_PROVIDER_SITE_OTHER): Payer: Medicare Other | Admitting: Physical Medicine and Rehabilitation

## 2016-04-19 VITALS — BP 155/74 | HR 81

## 2016-04-19 DIAGNOSIS — M961 Postlaminectomy syndrome, not elsewhere classified: Secondary | ICD-10-CM

## 2016-04-19 DIAGNOSIS — F411 Generalized anxiety disorder: Secondary | ICD-10-CM | POA: Diagnosis not present

## 2016-04-19 DIAGNOSIS — G894 Chronic pain syndrome: Secondary | ICD-10-CM

## 2016-04-19 DIAGNOSIS — M47816 Spondylosis without myelopathy or radiculopathy, lumbar region: Secondary | ICD-10-CM

## 2016-04-19 MED ORDER — ALPRAZOLAM 0.5 MG PO TABS: ORAL_TABLET | ORAL | 0 refills | Status: DC

## 2016-04-19 NOTE — Progress Notes (Signed)
Wendy Espinoza - 76 y.o. female MRN [id number]  Date of birth: 1940-06-29  Office Visit Note: Visit Date: 04/19/2016 PCP: Doreatha Martin, MD Referred by: No ref. provider found  Subjective: Chief Complaint  Patient presents with  . Lower Back - Pain   HPI: Wendy Espinoza is a pleasant but anxious 76 year old female who is present today with 2 family members to provide some history. She was kindly seen here at the request of Dr. Jarold Motto for looking at her chronic low back pain. She's had quite a history of medical problems and quite a history of lumbar spine surgery and thoracic surgery. She did have what appears to be a meningioma resection in her thoracic MRI from 2017 is reviewed below. There is fairly good anatomic alignment even though she talks about kyphosis in her upper back in terms of one of her problems when you talk to her. Nonetheless there is a laminectomy decompression from T9-T12. When really getting to the point of what's are worse problem is her problem with her lower back that radiates into both hips a little bit left more than right. She gets some pain down the legs without paresthesia. She says she is very difficult time walking and she does walk with 2 canes. She says the pain is always 7 out of 10 and very severe. She reports that she'll have to sit down due to the pain. It's worse with standing and ambulating better at rest. She is on a fentanyl patch as well as oxycodone and Cymbalta. She is in a skilled nursing facility at this point. She has not had any recent interventions in the lumbar spine in terms of injections. Again this is been a chronic problem and she had her first back surgery at the age of 65 she's had 5 total surgeries. We do have an MRI from 2016 and this is reviewed below. This basically does show L4-5 surgeries with fibrosis but no residual stenosis. She has not had a fusion. She has had therapy and undergoes current therapy. She does report weakness in the  legs. This is been an ongoing problem.    Lower back pain. Difficulty walking, ambulates with 2 canes. Has had lower back pain for years. First laminectomy at 25. Has had 5 surgeries. The last was about 2014. Had a mid thoracic tumor. Both sides are equal. Pain radiates down legs- left worse than right. With walking pain is at about a 7. At least 2 years since imaging done.  Review of Systems  Constitutional: Positive for malaise/fatigue. Negative for chills, fever and weight loss.  HENT: Negative for hearing loss and sinus pain.   Eyes: Negative for blurred vision, double vision and photophobia.  Respiratory: Negative for cough and shortness of breath.   Cardiovascular: Negative for chest pain, palpitations and leg swelling.  Gastrointestinal: Negative for abdominal pain, nausea and vomiting.  Genitourinary: Negative for flank pain.  Musculoskeletal: Positive for back pain, joint pain and neck pain. Negative for myalgias.  Skin: Negative for itching and rash.  Neurological: Positive for weakness. Negative for tremors and focal weakness.  Endo/Heme/Allergies: Negative.   Psychiatric/Behavioral: Negative for depression.  All other systems reviewed and are negative.  Otherwise per HPI.  Assessment & Plan: Visit Diagnoses:  1. Spondylosis without myelopathy or radiculopathy, lumbar region   2. Post laminectomy syndrome   3. Chronic pain syndrome   4. Pre-operative anxiety     Plan: Findings:  Chronic worsening severe low back pain worse with standing and  ambulating and better at rest consistent on exam with at least partially facet mediated pain. Reviewing her images do not show any focal nerve compression in the lumbar spine although there is some fibrosis at L4-5 from the multiple lumbar surgeries. There's been no fusion performed and there is facet arthropathy particularly at L3 Devashwar L4-5. She has a lot of straightening of the lumbar spine as well. Clearly she has some level of  chronic pain syndrome with the use of prolonged opioid management and in terms of multiple spine surgeries and widespread body pain. Her case is complicated by anxiety depression. We talked about diagnostic medial branch blocks to see if any of this pain in the lumbar spine was coming from the facet joints and possible radiofrequency ablation. We discussed the fact that is probably not going to give her an overall relief of a lot of her pain but it may help the low back and ambulating problems. She does want to try this. We will set up medial branch blocks of the L3-4, L4-5 facet joint. I did give her prescription for Xanax preprocedure. She does take clonazepam for anxiety. I spent more than 30 minutes speaking face-to-face with the patient with 50% of the time in counseling.    Meds & Orders:  Meds ordered this encounter  Medications  . ALPRAZolam (XANAX) 0.5 MG tablet    Sig: 1 by mouth, 1-2 hours preprocedure. May repeat if necessary.    Dispense:  2 tablet    Refill:  0   No orders of the defined types were placed in this encounter.   Follow-up: Return for Plan the medial branch block of the bilateral L3-4 L4-5 facet joints.   Procedures: No procedures performed  No notes on file   Clinical History: Lumbar Spine MRI 02/08/2014 IMPRESSION:   Abnormal MRI of the Lumbosacral spine with and without contrast.  1)L4 and L5 postsurgical bilateral laminectomy defects. At L4-L5 this is associated  with epidural fibrotic changes involving the right lateral spinal recess without clear  encroachment upon exiting right L4 nerve roots. More caudal cystic structure is seen  to displace more caudal roots and may be suggestive for seroma or arachnoiditis. If  clinically indicated lumbar myelogram may be additionally informative. 2) L2-L3 broad-based disc bulge with possible dynamic impingement upon the exiting  right L2 nerve root. 3) L3-L4 broad-based disc bulge and degenerative facet  changes producing mild central  spinal canal stenosis and possible dynamic impingement upon either exiting L3 nerve  roots. 4) L1-L2 degenerative disc and facet changes with nonenhancing minor disc and endplate  edema. 5) Mild degenerative disc and facet changes at T12-L1 as outlined above. No neural  element impingement identified.   Thoracic MRI 07/14/2015  Alignment: Near anatomic. There is a slight anterolisthesis at T2-3 and T3-4.  Vertebrae: No acute or suspicious osseous findings. There are scattered endplate degenerative changes throughout the thoracic spine. Patient is status post posterior decompression from T9 through T12.  Cord: The thoracic cord appears normal in signal and caliber. The conus medullaris extends to the upper L1 level. There is no abnormal intradural enhancement.  Paraspinal and other soft tissues: No significant paraspinal abnormalities. Lower left paraspinal mass is most consistent with a large hiatal hernia.  Disc levels:  There are mild degenerative changes throughout the thoracic spine with disc bulging and endplate degeneration. There is no significant disc herniation, spinal stenosis or nerve root encroachment.  IMPRESSION: 1. No acute findings or explanation for the patient's  symptoms. 2. There is no evidence of disc herniation, spinal stenosis or nerve root encroachment. 3. Posterior decompression of the spinal canal from T9 through T12 for reported meningioma. No recurrent abnormal intradural enhancement. 4. Large hiatal hernia. Correlation with scheduled thoracic spine radiographs recommended.  She reports that she has never smoked. She has never used smokeless tobacco. No results for input(s): HGBA1C, LABURIC in the last 8760 hours.  Objective:  VS:  HT:    WT:   BMI:     BP:(!) 155/74  HR:81bpm  TEMP: ( )  RESP:  Physical Exam  Constitutional: She is oriented to person, place, and time. She appears well-developed and  well-nourished. No distress.  HENT:  Head: Normocephalic and atraumatic.  Nose: Nose normal.  Mouth/Throat: Oropharynx is clear and moist.  Eyes: Conjunctivae are normal. Pupils are equal, round, and reactive to light.  Neck: Normal range of motion. Neck supple. No tracheal deviation present.  Cardiovascular: Regular rhythm and intact distal pulses.   Pulmonary/Chest: Effort normal. No respiratory distress. She has no wheezes.  Abdominal: She exhibits no distension.  Musculoskeletal:  Patient is very slow to rise from a seating position. She appears to be very unstable when she stands. She does use 2 canes in both hands for ambulation and is fairly steady but with a slow gait. Examination of her lumbar spine shows straightening without much lordosis. She has some tenderness across the paraspinal musculature and tenderness over the greater trochanters but not concordant with her pain. She has no pain with hip rotation. She has good distal strength with dorsiflexion plantarflexion. She has no clonus bilaterally.  Neurological: She is alert and oriented to person, place, and time. A sensory deficit is present. She exhibits normal muscle tone.  Skin: Skin is warm. No rash noted. No erythema.  Psychiatric: She has a normal mood and affect. Her behavior is normal.  Nursing note and vitals reviewed.   Ortho Exam Imaging: No results found.  Past Medical/Family/Surgical/Social History: Medications & Allergies reviewed per EMR Patient Active Problem List   Diagnosis Date Noted  . Kyphosis deformity of spine 02/22/2016  . Chronic pain syndrome 02/22/2016  . Anemia of chronic disease 02/22/2016  . Postoperative anemia due to acute blood loss 02/22/2016  . Restless leg syndrome 02/22/2016  . GERD (gastroesophageal reflux disease) 02/19/2016  . Hypothyroidism 02/19/2016  . HAP (hospital-acquired pneumonia) 02/19/2016  . Post-operative state   . Rectal bleeding   . Protein-calorie malnutrition,  severe 02/12/2016  . Evisceration of bowel 02/11/2016   Past Medical History:  Diagnosis Date  . Genital prolapse   . GERD (gastroesophageal reflux disease)   . Hypothyroid   . Kyphosis   . Mononucleosis, infectious, with hepatitis   . Polio   . Rectal prolapse   . Shingles   . Urinary incontinence   . Vitamin D deficiency    History reviewed. No pertinent family history. Past Surgical History:  Procedure Laterality Date  . ABDOMINAL HYSTERECTOMY    . BACK SURGERY    . COLON RESECTION SIGMOID N/A 02/11/2016   Procedure: COLON RESECTION SIGMOID;  Surgeon: Jimmye NormanJames Wyatt, MD;  [facility];  Service: General;  Laterality: N/A;  . COLOSTOMY N/A 02/11/2016   Procedure: COLOSTOMY;  Surgeon: Jimmye NormanJames Wyatt, MD;  [facility];  Service: General;  Laterality: N/A;  . LAPAROTOMY N/A 02/11/2016   Procedure: EXPLORATORY LAPAROTOMY , REDUCTION OF EVISERATION ,REPAIR PELVIC FLOOR;  Surgeon: Jimmye NormanJames Wyatt, MD;  Location: [facility] OR;  Service: General;  Laterality: N/A;   Social History   Occupational History  . Not on file.   Social History Main Topics  . Smoking status: Never Smoker  . Smokeless tobacco: Never Used  . Alcohol use Not on file  . Drug use: No  . Sexual activity: Not on file

## 2016-04-21 ENCOUNTER — Encounter (INDEPENDENT_AMBULATORY_CARE_PROVIDER_SITE_OTHER): Payer: Self-pay | Admitting: Physical Medicine and Rehabilitation

## 2016-05-03 ENCOUNTER — Encounter (INDEPENDENT_AMBULATORY_CARE_PROVIDER_SITE_OTHER): Payer: Self-pay | Admitting: Physical Medicine and Rehabilitation

## 2016-05-03 ENCOUNTER — Ambulatory Visit (INDEPENDENT_AMBULATORY_CARE_PROVIDER_SITE_OTHER): Payer: Medicare Other | Admitting: Physical Medicine and Rehabilitation

## 2016-05-03 ENCOUNTER — Ambulatory Visit (INDEPENDENT_AMBULATORY_CARE_PROVIDER_SITE_OTHER): Payer: Medicare Other

## 2016-05-03 VITALS — BP 153/61 | HR 88 | Temp 98.4°F

## 2016-05-03 DIAGNOSIS — M47816 Spondylosis without myelopathy or radiculopathy, lumbar region: Secondary | ICD-10-CM | POA: Diagnosis not present

## 2016-05-03 MED ORDER — BUPIVACAINE HCL 0.5 % IJ SOLN
50.0000 mL | Freq: Once | INTRAMUSCULAR | Status: AC
  Administered 2016-05-03: 50 mL

## 2016-05-03 MED ORDER — LIDOCAINE HCL (PF) 1 % IJ SOLN
0.3300 mL | Freq: Once | INTRAMUSCULAR | Status: AC
  Administered 2016-05-03: 0.3 mL

## 2016-05-03 NOTE — Patient Instructions (Signed)
.  Pain Diary:     Percent Relief   0% 25% 50% 75% 100%  Post       1 hour       2 hour       3 hour       4 hour         [facility] Discharge Instructions  *At any time if you have questions or concerns they can be answered by calling [phone]  All Patients: . You may experience an increase in your symptoms for the first 2 days (it can take 2 days to 2 weeks for the steroid/cortisone to have its maximal effect). . You may use ice to the site for the first 24 hours; 20 minutes on and 20 minutes off and may use heat after that time. . You may resume and continue your current pain medications. If you need a refill please contact the prescribing physician. . You may resume her regular medications if any were stopped for the procedure. . You may shower but no swimming, tub bath or Jacuzzi for 24 hours. . Please remove bandage after 4 hours. . You may resume light activities as tolerated. . If you had Spine Injection, you should not drive for the next 3 hours due to anesthetics used in the procedure. Please have someone drive for you.  *If you have had sedation, Valium, Xanax, or lorazepam: Do not drive or use public transportation for 24 hours, do not operating hazardous machinery or make important personal/business decisions for 24 hours.  POSSIBLE STEROID SIDE EFFECTS: If experienced these should only last for a short period. Change in menstrual flow  Edema in (swelling)  Increased appetite Skin flushing (redness)  Skin rash/acne  Thrush (oral) Vaginitis    Increased sweating  Depression Increased blood glucose levels Cramping and leg/calf  Euphoria (feeling happy)  POSSIBLE PROCEDURE SIDE EFFECTS: Please call our office if concerned. Increased pain Increased numbness/tingling  Headache Nausea/vomiting Hematoma (bruising/bleeding) Edema (swelling at the site) Weakness  Infection (red/drainage at site) Fever greater than 100.5F  *In the event of a headache  after epidural steroid injection: Drink plenty of fluids, especially water and try to lay flat when possible. If the headache does not get better after a few days or as always if concerned please call the office.  

## 2016-05-03 NOTE — Progress Notes (Signed)
Wendy Espinoza - 76 y.o. female MRN [id number]  Date of birth: 1940-08-26  Office Visit Note: Visit Date: 05/03/2016 PCP: Doreatha Martin, MD Referred by: Cheron Schaumann.,*  Subjective: Chief Complaint  Patient presents with  . Lower Back - Pain   HPI: Wendy Espinoza is a very pleasant but complicated chronic pain patient with chronic low back pain for many years. She has history of polio. She has history of prior back surgery. She comes in today for planned medial branch blocks of the L4-5 and L5-S1 joints diagnostically for her low back pain.  Please see our prior evaluation and management note for further details and justification.    ROS Otherwise per HPI.  Assessment & Plan: Visit Diagnoses:  1. Spondylosis without myelopathy or radiculopathy, lumbar region     Plan: Findings:  Diagnostic medial branch blocks of the bilateral L4-5 and L5-S1 facet joints for her bilateral low back pain.    Meds & Orders:  Meds ordered this encounter  Medications  . lidocaine (PF) (XYLOCAINE) 1 % injection 0.3 mL  . bupivacaine (MARCAINE) 0.5 % (with pres) injection 50 mL    Orders Placed This Encounter  Procedures  . Facet Injection  . XR C-ARM NO REPORT    Follow-up: Return for Pain diary review.   Procedures: No procedures performed  Lumbar Diagnostic Facet Joint Nerve Block with Fluoroscopic Guidance   Patient: Wendy Espinoza      Date of Birth: October 03, 1940 MRN: [id number] PCP: Doreatha Martin, MD      Visit Date: 05/03/2016   Universal Protocol:    Date/Time: 04/12/185:08 AM  Consent Given By: the patient  Position: PRONE  Additional Comments: Vital signs were monitored before and after the procedure. Patient was prepped and draped in the usual sterile fashion. The correct patient, procedure, and site was verified.   Injection Procedure Details:  Procedure Site One Meds Administered:  Meds ordered this encounter  Medications  . lidocaine (PF)  (XYLOCAINE) 1 % injection 0.3 mL  . bupivacaine (MARCAINE) 0.5 % (with pres) injection 50 mL     Laterality: Bilateral  Location/Site:  L4-L5 L5-S1  Needle size: 22 G  Needle type: spinal needle  Needle Placement: Oblique pedical  Findings:  -Contrast Used: 1 mL iohexol 180 mg iodine/mL   -Comments: Excellent flow of the articular pillars without intravascular flow.  Procedure Details: The fluoroscope beam is vertically oriented in AP and then obliqued 15 to 20 degrees to the ipsilateral side of the desired nerve to achieve the "Scotty dog" appearance.  The skin over the target area of the junction of the superior articulating process and the transverse process (sacral ala if blocking the L5 dorsal rami) was locally anesthetized with a 1 ml volume of 1% Lidocaine without Epinephrine.  The spinal needle was inserted and advanced in a trajectory view down to the target.   After contact with periosteum and negative aspirate for blood and CSF, correct placement without intravascular or epidural spread was confirmed by injecting 0.5 ml. of Isovue-250.  A spot radiograph was obtained of this image.    Next, a 0.5 ml. volume of the injectate described above was injected. The needle was then redirected to the other facet joint nerves mentioned above if needed.  Prior to the procedure, the patient was given a Pain Diary which was completed for baseline measurements.  After the procedure, the patient rated their pain every 30 minutes and will continue rating at this frequency for a total of  5 hours.  The patient has been asked to complete the Diary and return to [place] by mail, fax or hand delivered as soon as possible.   Additional Comments:   Dressing:     Post-procedure details: Patient was observed during the procedure. Post-procedure instructions were reviewed.  Patient left the clinic in stable condition.     Clinical History: Lumbar Spine MRI  02/08/2014 IMPRESSION:   Abnormal MRI of the Lumbosacral spine with and without contrast.  1)L4 and L5 postsurgical bilateral laminectomy defects. At L4-L5 this is associated  with epidural fibrotic changes involving the right lateral spinal recess without clear  encroachment upon exiting right L4 nerve roots. More caudal cystic structure is seen  to displace more caudal roots and may be suggestive for seroma or arachnoiditis. If  clinically indicated lumbar myelogram may be additionally informative. 2) L2-L3 broad-based disc bulge with possible dynamic impingement upon the exiting  right L2 nerve root. 3) L3-L4 broad-based disc bulge and degenerative facet changes producing mild central  spinal canal stenosis and possible dynamic impingement upon either exiting L3 nerve  roots. 4) L1-L2 degenerative disc and facet changes with nonenhancing minor disc and endplate  edema. 5) Mild degenerative disc and facet changes at T12-L1 as outlined above. No neural  element impingement identified.   Thoracic MRI 07/14/2015  Alignment: Near anatomic. There is a slight anterolisthesis at T2-3 and T3-4.  Vertebrae: No acute or suspicious osseous findings. There are scattered endplate degenerative changes throughout the thoracic spine. Patient is status post posterior decompression from T9 through T12.  Cord: The thoracic cord appears normal in signal and caliber. The conus medullaris extends to the upper L1 level. There is no abnormal intradural enhancement.  Paraspinal and other soft tissues: No significant paraspinal abnormalities. Lower left paraspinal mass is most consistent with a large hiatal hernia.  Disc levels:  There are mild degenerative changes throughout the thoracic spine with disc bulging and endplate degeneration. There is no significant disc herniation, spinal stenosis or nerve root encroachment.  IMPRESSION: 1. No acute findings or explanation for the  patient's symptoms. 2. There is no evidence of disc herniation, spinal stenosis or nerve root encroachment. 3. Posterior decompression of the spinal canal from T9 through T12 for reported meningioma. No recurrent abnormal intradural enhancement. 4. Large hiatal hernia. Correlation with scheduled thoracic spine radiographs recommended.  She reports that she has never smoked. She has never used smokeless tobacco. No results for input(s): HGBA1C, LABURIC in the last 8760 hours.  Objective:  VS:  HT:    WT:   BMI:     BP:(!) 153/61  HR:88bpm  TEMP:98.4 F (36.9 C)( )  RESP:95 % Physical Exam  Musculoskeletal:  Patient ambulates with a she has good distal strength. She has pain with extension of the lumbar spine.    Ortho Exam Imaging: No results found.  Past Medical/Family/Surgical/Social History: Medications & Allergies reviewed per EMR Patient Active Problem List   Diagnosis Date Noted  . Kyphosis deformity of spine 02/22/2016  . Chronic pain syndrome 02/22/2016  . Anemia of chronic disease 02/22/2016  . Postoperative anemia due to acute blood loss 02/22/2016  . Restless leg syndrome 02/22/2016  . GERD (gastroesophageal reflux disease) 02/19/2016  . Hypothyroidism 02/19/2016  . HAP (hospital-acquired pneumonia) 02/19/2016  . Post-operative state   . Rectal bleeding   . Protein-calorie malnutrition, severe 02/12/2016  . Evisceration of bowel 02/11/2016   Past Medical History:  Diagnosis Date  . Genital prolapse   .  GERD (gastroesophageal reflux disease)   . Hypothyroid   . Kyphosis   . Mononucleosis, infectious, with hepatitis   . Polio   . Rectal prolapse   . Shingles   . Urinary incontinence   . Vitamin D deficiency    No family history on file. Past Surgical History:  Procedure Laterality Date  . ABDOMINAL HYSTERECTOMY    . BACK SURGERY    . COLON RESECTION SIGMOID N/A 02/11/2016   Procedure: COLON RESECTION SIGMOID;  Surgeon: Jimmye Norman, MD;  Location:  [facility] OR;  Service: General;  Laterality: N/A;  . COLOSTOMY N/A 02/11/2016   Procedure: COLOSTOMY;  Surgeon: Jimmye Norman, MD;  Location: [facility] OR;  Service: General;  Laterality: N/A;  . LAPAROTOMY N/A 02/11/2016   Procedure: EXPLORATORY LAPAROTOMY , REDUCTION OF EVISERATION ,REPAIR PELVIC FLOOR;  Surgeon: Jimmye Norman, MD;  Location: [facility] OR;  Service: General;  Laterality: N/A;   Social History   Occupational History  . Not on file.   Social History Main Topics  . Smoking status: Never Smoker  . Smokeless tobacco: Never Used  . Alcohol use Not on file  . Drug use: No  . Sexual activity: Not on file

## 2016-05-05 ENCOUNTER — Telehealth (INDEPENDENT_AMBULATORY_CARE_PROVIDER_SITE_OTHER): Payer: Self-pay | Admitting: Physical Medicine and Rehabilitation

## 2016-05-05 NOTE — Telephone Encounter (Signed)
I called the patient, and she will mail pain diary and try to look up where she had PT to try to get the notes to [place]. I told her that once we have this we will submit everything to her insurance to try to get an RFA approved.

## 2016-05-05 NOTE — Telephone Encounter (Signed)
We need to get a copy of the pain diary. I need to have them and you look to see where her last physical therapy was performed and we need those notes and that we would try to get radiofrequency ablation approved or preauthorized.

## 2016-05-05 NOTE — Procedures (Signed)
Lumbar Diagnostic Facet Joint Nerve Block with Fluoroscopic Guidance   Patient: Wendy Espinoza      Date of Birth: 01/29/1940 MRN: [id number] PCP: Doreatha Martin, MD      Visit Date: 05/03/2016   Universal Protocol:    Date/Time: 04/12/185:08 AM  Consent Given By: the patient  Position: PRONE  Additional Comments: Vital signs were monitored before and after the procedure. Patient was prepped and draped in the usual sterile fashion. The correct patient, procedure, and site was verified.   Injection Procedure Details:  Procedure Site One Meds Administered:  Meds ordered this encounter  Medications  . lidocaine (PF) (XYLOCAINE) 1 % injection 0.3 mL  . bupivacaine (MARCAINE) 0.5 % (with pres) injection 50 mL     Laterality: Bilateral  Location/Site:  L4-L5 L5-S1  Needle size: 22 G  Needle type: spinal needle  Needle Placement: Oblique pedical  Findings:  -Contrast Used: 1 mL iohexol 180 mg iodine/mL   -Comments: Excellent flow of the articular pillars without intravascular flow.  Procedure Details: The fluoroscope beam is vertically oriented in AP and then obliqued 15 to 20 degrees to the ipsilateral side of the desired nerve to achieve the "Scotty dog" appearance.  The skin over the target area of the junction of the superior articulating process and the transverse process (sacral ala if blocking the L5 dorsal rami) was locally anesthetized with a 1 ml volume of 1% Lidocaine without Epinephrine.  The spinal needle was inserted and advanced in a trajectory view down to the target.   After contact with periosteum and negative aspirate for blood and CSF, correct placement without intravascular or epidural spread was confirmed by injecting 0.5 ml. of Isovue-250.  A spot radiograph was obtained of this image.    Next, a 0.5 ml. volume of the injectate described above was injected. The needle was then redirected to the other facet joint nerves mentioned above if  needed.  Prior to the procedure, the patient was given a Pain Diary which was completed for baseline measurements.  After the procedure, the patient rated their pain every 30 minutes and will continue rating at this frequency for a total of 5 hours.  The patient has been asked to complete the Diary and return to [place] by mail, fax or hand delivered as soon as possible.   Additional Comments:   Dressing:     Post-procedure details: Patient was observed during the procedure. Post-procedure instructions were reviewed.  Patient left the clinic in stable condition.

## 2016-06-08 ENCOUNTER — Other Ambulatory Visit: Payer: Self-pay | Admitting: General Surgery

## 2016-06-08 DIAGNOSIS — Z933 Colostomy status: Secondary | ICD-10-CM

## 2016-06-16 ENCOUNTER — Ambulatory Visit
Admission: RE | Admit: 2016-06-16 | Discharge: 2016-06-16 | Disposition: A | Discharge status: Home / Self Care | Payer: Medicare Other | Source: Ambulatory Visit | Attending: General Surgery | Admitting: General Surgery

## 2016-06-16 DIAGNOSIS — Z933 Colostomy status: Secondary | ICD-10-CM

## 2016-06-17 ENCOUNTER — Other Ambulatory Visit: Payer: Medicare Other

## 2016-06-24 ENCOUNTER — Ambulatory Visit: Payer: Self-pay | Admitting: General Surgery

## 2016-06-29 NOTE — Telephone Encounter (Signed)
Patient has not called back or sent us the required information. Closing call and will wait for patient to call back.

## 2016-07-14 ENCOUNTER — Emergency Department (HOSPITAL_COMMUNITY)
Admission: EM | Admit: 2016-07-14 | Discharge: 2016-07-14 | Disposition: A | Discharge status: Home / Self Care | Payer: Medicare Other | Attending: Dermatology | Admitting: Dermatology

## 2016-07-14 DIAGNOSIS — Z5321 Procedure and treatment not carried out due to patient leaving prior to being seen by health care provider: Secondary | ICD-10-CM | POA: Diagnosis not present

## 2016-07-14 DIAGNOSIS — Y92002 Bathroom of unspecified non-institutional (private) residence single-family (private) house as the place of occurrence of the external cause: Secondary | ICD-10-CM | POA: Insufficient documentation

## 2016-07-14 DIAGNOSIS — Y939 Activity, unspecified: Secondary | ICD-10-CM | POA: Diagnosis not present

## 2016-07-14 DIAGNOSIS — W01198A Fall on same level from slipping, tripping and stumbling with subsequent striking against other object, initial encounter: Secondary | ICD-10-CM | POA: Insufficient documentation

## 2016-07-14 DIAGNOSIS — S0990XA Unspecified injury of head, initial encounter: Secondary | ICD-10-CM | POA: Insufficient documentation

## 2016-07-14 DIAGNOSIS — Y999 Unspecified external cause status: Secondary | ICD-10-CM | POA: Diagnosis not present

## 2016-07-14 DIAGNOSIS — Z79899 Other long term (current) drug therapy: Secondary | ICD-10-CM | POA: Insufficient documentation

## 2016-07-14 NOTE — ED Notes (Addendum)
Pt was in the bathroom, feeling sleepy and nodded off when she fell and hit her forehead on the cabinet. Pt reported unusual sleepiness today after the fall. Pt has hx of narcolepsy and took her medication, which would usually wake her up. Pt c/o throbbing pain in the back of the head with head movement or palpation.

## 2016-07-14 NOTE — ED Notes (Signed)
Registration came to triage desk to let us know patient left

## 2016-07-14 NOTE — ED Triage Notes (Signed)
Pt reports that she fell last night while using restroom. Pt reports that she hit her head and has reported increased drowsiness. Pt currently alert and oriented at this time. Bruising purple in color noted to right side of forehead.

## 2016-07-25 ENCOUNTER — Encounter (HOSPITAL_COMMUNITY): Payer: Self-pay | Admitting: Pharmacy Technician

## 2016-07-25 ENCOUNTER — Observation Stay (HOSPITAL_COMMUNITY)
Admission: EM | Admit: 2016-07-25 | Discharge: 2016-07-27 | Disposition: A | Discharge status: Home / Self Care | Payer: Medicare Other | Attending: Internal Medicine | Admitting: Internal Medicine

## 2016-07-25 ENCOUNTER — Emergency Department (HOSPITAL_COMMUNITY): Payer: Medicare Other

## 2016-07-25 DIAGNOSIS — K5900 Constipation, unspecified: Secondary | ICD-10-CM | POA: Insufficient documentation

## 2016-07-25 DIAGNOSIS — G14 Postpolio syndrome: Secondary | ICD-10-CM | POA: Insufficient documentation

## 2016-07-25 DIAGNOSIS — Z933 Colostomy status: Secondary | ICD-10-CM

## 2016-07-25 DIAGNOSIS — Z9049 Acquired absence of other specified parts of digestive tract: Secondary | ICD-10-CM | POA: Diagnosis not present

## 2016-07-25 DIAGNOSIS — K219 Gastro-esophageal reflux disease without esophagitis: Secondary | ICD-10-CM | POA: Diagnosis not present

## 2016-07-25 DIAGNOSIS — D638 Anemia in other chronic diseases classified elsewhere: Secondary | ICD-10-CM | POA: Diagnosis not present

## 2016-07-25 DIAGNOSIS — G2581 Restless legs syndrome: Secondary | ICD-10-CM | POA: Insufficient documentation

## 2016-07-25 DIAGNOSIS — Z79891 Long term (current) use of opiate analgesic: Secondary | ICD-10-CM | POA: Insufficient documentation

## 2016-07-25 DIAGNOSIS — E039 Hypothyroidism, unspecified: Secondary | ICD-10-CM | POA: Diagnosis not present

## 2016-07-25 DIAGNOSIS — G894 Chronic pain syndrome: Secondary | ICD-10-CM | POA: Diagnosis not present

## 2016-07-25 DIAGNOSIS — Z79899 Other long term (current) drug therapy: Secondary | ICD-10-CM | POA: Diagnosis not present

## 2016-07-25 DIAGNOSIS — D509 Iron deficiency anemia, unspecified: Secondary | ICD-10-CM | POA: Diagnosis not present

## 2016-07-25 DIAGNOSIS — R5382 Chronic fatigue, unspecified: Secondary | ICD-10-CM | POA: Insufficient documentation

## 2016-07-25 DIAGNOSIS — G9340 Encephalopathy, unspecified: Secondary | ICD-10-CM | POA: Diagnosis not present

## 2016-07-25 DIAGNOSIS — E876 Hypokalemia: Secondary | ICD-10-CM | POA: Diagnosis not present

## 2016-07-25 DIAGNOSIS — R109 Unspecified abdominal pain: Secondary | ICD-10-CM

## 2016-07-25 LAB — SALICYLATE LEVEL: Salicylate Lvl: <7 mg/dL (ref 2.8–30.0)

## 2016-07-25 LAB — CBC WITH DIFFERENTIAL/PLATELET
BASOS ABS: 0.1 10*3/uL (ref 0.0–0.1)
Basophils Relative: 1 %
EOS ABS: 0.2 10*3/uL (ref 0.0–0.7)
Eosinophils Relative: 3 %
HCT: 27.5 % — ABNORMAL LOW (ref 36.0–46.0)
Hemoglobin: 8.1 g/dL — ABNORMAL LOW (ref 12.0–15.0)
LYMPHS ABS: 1.7 10*3/uL (ref 0.7–4.0)
LYMPHS PCT: 27 %
MCH: 21.1 pg — ABNORMAL LOW (ref 26.0–34.0)
MCHC: 29.5 g/dL — AB (ref 30.0–36.0)
MCV: 71.6 fL — ABNORMAL LOW (ref 78.0–100.0)
Monocytes Absolute: 0.6 10*3/uL (ref 0.1–1.0)
Monocytes Relative: 10 %
NEUTROS ABS: 3.7 10*3/uL (ref 1.7–7.7)
Neutrophils Relative %: 59 %
PLATELETS: 370 10*3/uL (ref 150–400)
RBC: 3.84 MIL/uL — ABNORMAL LOW (ref 3.87–5.11)
RDW: 18.8 % — AB (ref 11.5–15.5)
WBC: 6.3 10*3/uL (ref 4.0–10.5)

## 2016-07-25 LAB — I-STAT ARTERIAL BLOOD GAS, ED
Acid-Base Excess: 5 mmol/L — ABNORMAL HIGH (ref 0.0–2.0)
BICARBONATE: 31.2 mmol/L — AB (ref 20.0–28.0)
O2 Saturation: 92 %
PCO2 ART: 52 mmHg — AB (ref 32.0–48.0)
Patient temperature: 98.6
TCO2: 33 mmol/L (ref 0–100)
pH, Arterial: 7.386 (ref 7.350–7.450)
pO2, Arterial: 66 mmHg — ABNORMAL LOW (ref 83.0–108.0)

## 2016-07-25 LAB — COMPREHENSIVE METABOLIC PANEL
ALBUMIN: 3.4 g/dL — AB (ref 3.5–5.0)
ALT: 36 U/L (ref 14–54)
ANION GAP: 9 (ref 5–15)
AST: 42 U/L — ABNORMAL HIGH (ref 15–41)
Alkaline Phosphatase: 73 U/L (ref 38–126)
BUN: 31 mg/dL — ABNORMAL HIGH (ref 6–20)
CO2: 28 mmol/L (ref 22–32)
Calcium: 9 mg/dL (ref 8.9–10.3)
Chloride: 101 mmol/L (ref 101–111)
Creatinine, Ser: 0.53 mg/dL (ref 0.44–1.00)
GFR calc Af Amer: >60 mL/min (ref >60)
GFR calc non Af Amer: >60 mL/min (ref >60)
GLUCOSE: 91 mg/dL (ref 65–99)
POTASSIUM: 3.1 mmol/L — AB (ref 3.5–5.1)
SODIUM: 138 mmol/L (ref 135–145)
TOTAL PROTEIN: 6.5 g/dL (ref 6.5–8.1)
Total Bilirubin: 0.3 mg/dL (ref 0.3–1.2)

## 2016-07-25 LAB — RAPID URINE DRUG SCREEN, HOSP PERFORMED
Amphetamines: NOT DETECTED
BARBITURATES: NOT DETECTED
BENZODIAZEPINES: NOT DETECTED
COCAINE: NOT DETECTED
Opiates: NOT DETECTED
TETRAHYDROCANNABINOL: NOT DETECTED

## 2016-07-25 LAB — URINALYSIS, ROUTINE W REFLEX MICROSCOPIC
Bilirubin Urine: NEGATIVE
GLUCOSE, UA: NEGATIVE mg/dL
HGB URINE DIPSTICK: NEGATIVE
Ketones, ur: NEGATIVE mg/dL
Leukocytes, UA: NEGATIVE
Nitrite: NEGATIVE
PH: 5 (ref 5.0–8.0)
PROTEIN: NEGATIVE mg/dL
Specific Gravity, Urine: 1.015 (ref 1.005–1.030)

## 2016-07-25 LAB — CBG MONITORING, ED: GLUCOSE-CAPILLARY: 91 mg/dL (ref 65–99)

## 2016-07-25 LAB — TSH: TSH: 2.344 u[IU]/mL (ref 0.350–4.500)

## 2016-07-25 LAB — ETHANOL: Alcohol, Ethyl (B): <5 mg/dL (ref <5)

## 2016-07-25 LAB — CK: Total CK: 235 U/L — ABNORMAL HIGH (ref 38–234)

## 2016-07-25 LAB — ACETAMINOPHEN LEVEL: Acetaminophen (Tylenol), Serum: <10 ug/mL — ABNORMAL LOW (ref 10–30)

## 2016-07-25 LAB — AMMONIA: Ammonia: <9 umol/L — ABNORMAL LOW (ref 9–35)

## 2016-07-25 MED ORDER — SODIUM CHLORIDE 0.9 % IV BOLUS (SEPSIS)
1000.0000 mL | Freq: Once | INTRAVENOUS | Status: AC
  Administered 2016-07-25: 1000 mL via INTRAVENOUS

## 2016-07-25 MED ORDER — SODIUM CHLORIDE 0.9 % IV SOLN
INTRAVENOUS | Status: DC
  Administered 2016-07-25 – 2016-07-27 (×3): via INTRAVENOUS

## 2016-07-25 MED ORDER — ORAL CARE MOUTH RINSE
15.0000 mL | Freq: Two times a day (BID) | OROMUCOSAL | Status: DC
  Administered 2016-07-26 – 2016-07-27 (×3): 15 mL via OROMUCOSAL

## 2016-07-25 MED ORDER — POTASSIUM CHLORIDE CRYS ER 20 MEQ PO TBCR
40.0000 meq | EXTENDED_RELEASE_TABLET | Freq: Once | ORAL | Status: DC
  Filled 2016-07-25: qty 2

## 2016-07-25 MED ORDER — CHLORHEXIDINE GLUCONATE 0.12 % MT SOLN
15.0000 mL | Freq: Two times a day (BID) | OROMUCOSAL | Status: DC
  Administered 2016-07-26 – 2016-07-27 (×3): 15 mL via OROMUCOSAL
  Filled 2016-07-25 (×3): qty 15

## 2016-07-25 MED ORDER — ONDANSETRON HCL 4 MG PO TABS: 4.0000 mg | ORAL_TABLET | Freq: Four times a day (QID) | ORAL | Status: DC | PRN

## 2016-07-25 MED ORDER — ACETAMINOPHEN 325 MG PO TABS
650.0000 mg | ORAL_TABLET | Freq: Four times a day (QID) | ORAL | Status: DC | PRN
  Administered 2016-07-26 – 2016-07-27 (×2): 650 mg via ORAL
  Filled 2016-07-25 (×3): qty 2

## 2016-07-25 MED ORDER — ONDANSETRON HCL 4 MG/2ML IJ SOLN: 4.0000 mg | Freq: Four times a day (QID) | INTRAMUSCULAR | Status: DC | PRN

## 2016-07-25 MED ORDER — SODIUM CHLORIDE 0.9 % IV SOLN: INTRAVENOUS | Status: DC

## 2016-07-25 MED ORDER — ACETAMINOPHEN 650 MG RE SUPP: 650.0000 mg | Freq: Four times a day (QID) | RECTAL | Status: DC | PRN

## 2016-07-25 NOTE — ED Notes (Signed)
Pt states she is unable to take K+ on an empty stomach.

## 2016-07-25 NOTE — ED Notes (Signed)
Patient transported to CT 

## 2016-07-25 NOTE — Progress Notes (Signed)
Fentanyl 50mcg patch taken off patient's R thigh and wasted in sharps container.  Witnessed by Boneta LucksJenny, RN.

## 2016-07-25 NOTE — Progress Notes (Signed)
Attempted to call Bridgett(pt's sister-in-law) at request of patient.  No answer but left message per patient's request.

## 2016-07-25 NOTE — ED Notes (Signed)
Respiratory aware of ABG order 

## 2016-07-25 NOTE — H&P (Signed)
History and Physical    Wendy MannerBarbara K Honeycutt BJY:[id number] DOB: 02/28/1940 DOA: 07/25/2016  PCP: Cheron SchaumannVelazquez, Gretchen Y., MD  Patient coming from: Home.  Chief Complaint: Patient was found confused in her car.  HPI: Wendy Espinoza is a 76 y.o. female with history of chronic pain post polio syndrome, hypothyroidism was found to be confused in a car and was brought to the ER. Patient states she had ran out of her oxycodone and was unable to go to her neurologist for refill on her pain medications and had one Duragesic patch left which she started using 2 days ago. She was driving her car for shopping when she felt uneasy and stopped at the parking. She realizes that some people knocking on the door. Patient was brought to the ER.   ED Course: In the ER patient appeared drowsy and confused. Labs revealed nothing acute. CT head was unremarkable. ABG was showing only mild increase in CO2. On my exam patient still appears drowsy but answers questions appropriately and has been admitted for further observation.  Review of Systems: As per HPI, rest all negative.   Past Medical History:  Diagnosis Date  . Genital prolapse   . GERD (gastroesophageal reflux disease)   . Hypothyroid   . Kyphosis   . Mononucleosis, infectious, with hepatitis   . Polio   . Rectal prolapse   . Shingles   . Urinary incontinence   . Vitamin D deficiency     Past Surgical History:  Procedure Laterality Date  . ABDOMINAL HYSTERECTOMY    . BACK SURGERY    . COLON RESECTION SIGMOID N/A 02/11/2016   Procedure: COLON RESECTION SIGMOID;  Surgeon: Jimmye NormanJames Wyatt, MD;  [facility];  Service: General;  Laterality: N/A;  . COLOSTOMY N/A 02/11/2016   Procedure: COLOSTOMY;  Surgeon: Jimmye NormanJames Wyatt, MD;  [facility];  Service: General;  Laterality: N/A;  . LAPAROTOMY N/A 02/11/2016   Procedure: EXPLORATORY LAPAROTOMY , REDUCTION OF EVISERATION ,REPAIR PELVIC FLOOR;  Surgeon: Jimmye NormanJames Wyatt, MD;  Location: [facility] OR;  Service: General;   Laterality: N/A;     reports that she has never smoked. She has never used smokeless tobacco. She reports that she does not use drugs. Her alcohol history is not on file.  Allergies  Allergen Reactions  . Gluten Meal Itching, Rash and Other (See Comments)    Severe constipation  . Lactose Intolerance (Gi) Nausea And Vomiting and Other (See Comments)    Severe constipation  . Topamax [Topiramate] Itching and Other (See Comments)    Severe constipation  . Wheat Bran Itching, Rash and Other (See Comments)    Severe constipation  . Gabapentin Itching and Other (See Comments)    constipation  . Lactase Nausea And Vomiting and Other (See Comments)    Severe constipation  . Lyrica [Pregabalin] Other (See Comments)    confusion  . Soy Allergy Other (See Comments)    Flatulence/bloating  . Tagamet [Cimetidine] Nausea And Vomiting  . Tizanidine Other (See Comments)    Possibly bothered stomach  . Tylenol [Acetaminophen] Itching  . Nsaids Itching, Rash and Other (See Comments)    Bruises easily after longterm use    Family History  Problem Relation Age of Onset  . Hypertension Other     Prior to Admission medications   Medication Sig Start Date End Date Taking? Authorizing Provider  ALPRAZolam (XANAX) 0.25 MG tablet Take 0.25 mg by mouth 2 (two) times daily as needed for anxiety.  Yes [provider]  clonazePAM (KLONOPIN) 0.5 MG tablet Take 0.5 mg by mouth daily. for anxiety 12/12/15  Yes [provider]  DULoxetine (CYMBALTA) 60 MG capsule Take 60 mg by mouth daily. 01/05/16  Yes [provider]  fentaNYL (DURAGESIC - DOSED MCG/HR) 50 MCG/HR Place 50 mcg onto the skin every 3 (three) days. 01/13/16  Yes [provider]  hydrochlorothiazide (HYDRODIURIL) 25 MG tablet Take 25 mg by mouth daily.   Yes [provider]  levothyroxine (SYNTHROID, LEVOTHROID) 100 MCG tablet Take 100 mcg by mouth daily before breakfast. 02/11/16  Yes [provider]  modafinil (PROVIGIL) 200 MG tablet Take 2 tablets (400 mg total) by mouth daily. 02/23/16  Yes Sherrie George, PA-C  omeprazole (PRILOSEC) 40 MG capsule Take 40 mg by mouth daily. 11/09/15  Yes [provider]  rOPINIRole (REQUIP) 2 MG tablet Take 2-6 mg by mouth See admin instructions. Take 1 tablet (2 mg) by mouth daily with supper and 2-3 tablets (4-6 mg) at bedtime 12/03/15  Yes [provider]  albuterol (PROVENTIL HFA;VENTOLIN HFA) 108 (90 Base) MCG/ACT inhaler Inhale 2 puffs into the lungs every 6 (six) hours as needed for wheezing or shortness of breath.    [provider]  ALPRAZolam Prudy Feeler) 0.5 MG tablet 1 by mouth, 1-2 hours preprocedure. May repeat if necessary. 04/19/16   Tyrell Antonio, MD  Amino Acids-Protein Hydrolys (FEEDING SUPPLEMENT, PRO-STAT SUGAR FREE 64,) LIQD Take 30 mLs by mouth 3 (three) times daily between meals. 02/16/16   Adam Phenix, PA-C  cetirizine (ZYRTEC) 10 MG tablet Take 10 mg by mouth daily as needed (seasonal allergies/ exposure to dust).    [provider]  ferrous gluconate (FERGON) 324 MG tablet Take 1 tablet (324 mg total) by mouth daily after supper. 02/23/16   Sherrie George, PA-C  magic mouthwash w/lidocaine SOLN Take 5 mLs by mouth 4 (four) times daily -  before meals and at bedtime. Patient not taking: Reported on 05/03/2016 02/23/16   Sherrie George, PA-C  meclizine (ANTIVERT) 25 MG tablet Take 25 mg by mouth 3 (three) times daily as needed for nausea.  12/01/15   [provider]  methocarbamol (ROBAXIN) 500 MG tablet Take 1 tablet (500 mg total) by mouth every 8 (eight) hours as needed for muscle spasms. 02/16/16   Adam Phenix, PA-C  metroNIDAZOLE (FLAGYL) 500 MG tablet Take 1,000 mg by mouth See admin instructions. Per Dr. Lindie Spruce  - take 2 tablets (1000 mg) by mouth at 2pm, 3pm and 10pm on the day before colon operation    [provider]  Multiple Vitamin  (MULTIVITAMIN WITH MINERALS) TABS tablet You can get these at any drug store, take 1 daily after lunch. 02/22/16   Sherrie George, PA-C  neomycin (MYCIFRADIN) 500 MG tablet Take 500 mg by mouth See admin instructions. Per Dr. Lindie Spruce - take 2 tablets (1000 mg) by mouth at 2pm, 3pm and 10pm on the day before colon operation    [provider]  oxyCODONE (OXY IR/ROXICODONE) 5 MG immediate release tablet Take 1 tablet (5 mg total) by mouth every 4 (four) hours as needed for moderate pain. 02/22/16   Sherrie George, PA-C  Polyethyl Glycol-Propyl Glycol (SYSTANE OP) Place 1 drop into both eyes 3 (three) times daily as needed (dry eyes/ irritation).     [provider]  senna (SENOKOT) 8.6 MG TABS tablet Take 1 tablet (8.6 mg total) by mouth daily. 02/16/16   Adam Phenix,  PA-C  zolpidem (AMBIEN) 5 MG tablet Take 5 mg by mouth at bedtime as needed for sleep.    [provider]    Physical Exam: Vitals:   07/25/16 1945 07/25/16 2000 07/25/16 2015 07/25/16 2030  BP: (!) 106/46 (!) 103/44 (!) 115/48 (!) 123/51  Pulse: 79 78 80 80  Resp: (!) 8 (!) 8 (!) 9 13  Temp:      TempSrc:      SpO2: 95% 94% 96% 94%  Weight:      Height:          Constitutional: Moderately built and nourished. Vitals:   07/25/16 1945 07/25/16 2000 07/25/16 2015 07/25/16 2030  BP: (!) 106/46 (!) 103/44 (!) 115/48 (!) 123/51  Pulse: 79 78 80 80  Resp: (!) 8 (!) 8 (!) 9 13  Temp:      TempSrc:      SpO2: 95% 94% 96% 94%  Weight:      Height:       Eyes: Anicteric no pallor. ENMT: No discharge from the ears eyes nose and mouth. Neck: No mass felt. No neck rigidity. Respiratory: No rhonchi or crepitations. Cardiovascular: S1 and S2 heard no murmurs appreciated. Abdomen: Soft nontender bowel sounds present. Musculoskeletal: No edema. No joint effusion. Skin: No rash. Skin appears warm. Neurologic: Mildly lethargic but answers questions appropriately and is oriented to place and  name. Moves all extremities 5 x 5. Psychiatric: Mildly lethargic. Denies any suicidal thoughts.   Labs on Admission: I have personally reviewed following labs and imaging studies  CBC:  Recent Labs Lab 07/25/16 1638  WBC 6.3  NEUTROABS 3.7  HGB 8.1*  HCT 27.5*  MCV 71.6*  PLT 370   Basic Metabolic Panel:  Recent Labs Lab 07/25/16 1638  NA 138  K 3.1*  CL 101  CO2 28  GLUCOSE 91  BUN 31*  CREATININE 0.53  CALCIUM 9.0   GFR: Estimated Creatinine Clearance: 51.8 mL/min (by C-G formula based on SCr of 0.53 mg/dL). Liver Function Tests:  Recent Labs Lab 07/25/16 1638  AST 42*  ALT 36  ALKPHOS 73  BILITOT 0.3  PROT 6.5  ALBUMIN 3.4*   No results for input(s): LIPASE, AMYLASE in the last 168 hours. No results for input(s): AMMONIA in the last 168 hours. Coagulation Profile: No results for input(s): INR, PROTIME in the last 168 hours. Cardiac Enzymes:  Recent Labs Lab 07/25/16 1638  CKTOTAL 235*   BNP (last 3 results) No results for input(s): PROBNP in the last 8760 hours. HbA1C: No results for input(s): HGBA1C in the last 72 hours. CBG:  Recent Labs Lab 07/25/16 1606  GLUCAP 91   Lipid Profile: No results for input(s): CHOL, HDL, LDLCALC, TRIG, CHOLHDL, LDLDIRECT in the last 72 hours. Thyroid Function Tests:  Recent Labs  07/25/16 1645  TSH 2.344   Anemia Panel: No results for input(s): VITAMINB12, FOLATE, FERRITIN, TIBC, IRON, RETICCTPCT in the last 72 hours. Urine analysis:    Component Value Date/Time   COLORURINE YELLOW 07/25/2016 1742   APPEARANCEUR HAZY (A) 07/25/2016 1742   LABSPEC 1.015 07/25/2016 1742   PHURINE 5.0 07/25/2016 1742   GLUCOSEU NEGATIVE 07/25/2016 1742   HGBUR NEGATIVE 07/25/2016 1742   BILIRUBINUR NEGATIVE 07/25/2016 1742   KETONESUR NEGATIVE 07/25/2016 1742   PROTEINUR NEGATIVE 07/25/2016 1742   NITRITE NEGATIVE 07/25/2016 1742   LEUKOCYTESUR NEGATIVE 07/25/2016 1742   Sepsis  Labs: @LABRCNTIP (procalcitonin:4,lacticidven:4) )No results found for this or any previous visit (from the past 240  hour(s)).   Radiological Exams on Admission: Ct Head Wo Contrast  Result Date: 07/25/2016 CLINICAL DATA:  76 year old female with altered mental status. Initial encounter. EXAM: CT HEAD WITHOUT CONTRAST TECHNIQUE: Contiguous axial images were obtained from the base of the skull through the vertex without intravenous contrast. COMPARISON:  No comparison head CT. FINDINGS: Brain: No intracranial hemorrhage or CT evidence of large acute infarct. No intracranial mass lesion noted on this unenhanced exam. Vascular: Vascular calcifications. Skull: No acute abnormality. Sinuses/Orbits: No acute orbital abnormality. Minimal asymmetry superior ophthalmic vein without cause identified. Paranasal sinuses are clear. Other: Mastoid air cells and middle ear cavities are clear. IMPRESSION: No acute intracranial abnormality noted. Electronically Signed   By: Lacy Duverney M.D.   On: 07/25/2016 18:26    EKG: Independently reviewed. Normal sinus rhythm and LVH  Assessment/Plan Principal Problem:   Acute encephalopathy Active Problems:   Chronic pain syndrome   Anemia of chronic disease    1. Acute encephalopathy - likely from medications. Patient remains afebrile no signs of infection. At this time Duragesic patch has been discontinued am holding all patient's home medications for now except for Synthroid. Once patient becomes more alert and awake restart medication slowly. Patient states that she usually takes oxycodone. May discuss with patient's neurologist Dr. Adelene Idler in a.m. at [facility] with regarding to her medications. 2. Hypothyroidism on IV Synthroid for now. If patient becomes more alert awake may start on oral Synthroid. 3. 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 4. Colostomy bag after recent surgery in January 2018.   DVT  prophylaxis: SCDs. Code Status: Full code.  Family Communication: Discussed with patient.  Disposition Plan: Home.  Consults called: None.  Admission status: Observation.    Eduard Clos MD [facility] Pager [phone].  If 7PM-7AM, please contact night-coverage [email or web address] Password [facility]  07/25/2016, 8:47 PM

## 2016-07-25 NOTE — ED Provider Notes (Signed)
[facility]-EMERGENCY DEPT Provider Note   CSN: [id number] Arrival date & time: 07/25/16  1549     History   Chief Complaint Chief Complaint  Patient presents with  . Heat Exposure    HPI Wendy Espinoza is a 76 y.o. female.  76 year old female presents with altered mental status 1 day. Was found in her car with the heat turned on. She was diaphoretic. When questioned directly why she was in her car with acute onset she cannot answer. Denies any recent fevers.  Denies any headache, vomiting, chest or abdominal discomfort. Did have a fentanyl patch on her right thigh. She denies any recent alcohol or drug use. CBG was 84 and patient was transported here. Patients history is due to her current state.      Past Medical History:  Diagnosis Date  . Genital prolapse   . GERD (gastroesophageal reflux disease)   . Hypothyroid   . Kyphosis   . Mononucleosis, infectious, with hepatitis   . Polio   . Rectal prolapse   . Shingles   . Urinary incontinence   . Vitamin D deficiency     Patient Active Problem List   Diagnosis Date Noted  . Kyphosis deformity of spine 02/22/2016  . Chronic pain syndrome 02/22/2016  . Anemia of chronic disease 02/22/2016  . Postoperative anemia due to acute blood loss 02/22/2016  . Restless leg syndrome 02/22/2016  . GERD (gastroesophageal reflux disease) 02/19/2016  . Hypothyroidism 02/19/2016  . HAP (hospital-acquired pneumonia) 02/19/2016  . Post-operative state   . Rectal bleeding   . Protein-calorie malnutrition, severe 02/12/2016  . Evisceration of bowel 02/11/2016    Past Surgical History:  Procedure Laterality Date  . ABDOMINAL HYSTERECTOMY    . BACK SURGERY    . COLON RESECTION SIGMOID N/A 02/11/2016   Procedure: COLON RESECTION SIGMOID;  Surgeon: Jimmye Norman, MD;  Location: [facility] OR;  Service: General;  Laterality: N/A;  . COLOSTOMY N/A 02/11/2016   Procedure: COLOSTOMY;  Surgeon: Jimmye Norman, MD;  Location: [facility] OR;  Service: General;   Laterality: N/A;  . LAPAROTOMY N/A 02/11/2016   Procedure: EXPLORATORY LAPAROTOMY , REDUCTION OF EVISERATION ,REPAIR PELVIC FLOOR;  Surgeon: Jimmye Norman, MD;  Location: [facility] OR;  Service: General;  Laterality: N/A;    OB History    No data available       Home Medications    Prior to Admission medications   Medication Sig Start Date End Date Taking? Authorizing Provider  albuterol (PROVENTIL HFA;VENTOLIN HFA) 108 (90 Base) MCG/ACT inhaler Inhale 2 puffs into the lungs every 6 (six) hours as needed for wheezing or shortness of breath.    [provider]  ALPRAZolam Prudy Feeler) 0.5 MG tablet 1 by mouth, 1-2 hours preprocedure. May repeat if necessary. 04/19/16   Tyrell Antonio, MD  Amino Acids-Protein Hydrolys (FEEDING SUPPLEMENT, PRO-STAT SUGAR FREE 64,) LIQD Take 30 mLs by mouth 3 (three) times daily between meals. 02/16/16   Adam Phenix, PA-C  amoxicillin-clavulanate (AUGMENTIN) 875-125 MG tablet Take 1 tablet by mouth every 12 (twelve) hours. Patient not taking: Reported on 05/03/2016 02/22/16   Sherrie George, PA-C  cetirizine (ZYRTEC) 10 MG tablet Take 10 mg by mouth daily as needed (seasonal allergies/ exposure to dust).    [provider]  clonazePAM (KLONOPIN) 0.5 MG tablet Take 0.5 mg by mouth 2 (two) times daily as needed for anxiety (depression). for anxiety 12/12/15   [provider]  DULoxetine (CYMBALTA) 60 MG capsule Take 60 mg by mouth daily.  01/05/16   [provider]  fentaNYL (DURAGESIC - DOSED MCG/HR) 50 MCG/HR Place 50 mcg onto the skin every 3 (three) days. 01/13/16   [provider]  ferrous gluconate (FERGON) 324 MG tablet Take 1 tablet (324 mg total) by mouth daily after supper. 02/23/16   Sherrie George, PA-C  levothyroxine (SYNTHROID, LEVOTHROID) 100 MCG tablet Take 100 mcg by mouth daily before breakfast. 02/11/16   [provider]  magic mouthwash w/lidocaine SOLN Take 5 mLs by mouth 4 (four) times daily -   before meals and at bedtime. Patient not taking: Reported on 05/03/2016 02/23/16   Sherrie George, PA-C  meclizine (ANTIVERT) 25 MG tablet Take 25 mg by mouth 3 (three) times daily as needed for nausea.  12/01/15   [provider]  methocarbamol (ROBAXIN) 500 MG tablet Take 1 tablet (500 mg total) by mouth every 8 (eight) hours as needed for muscle spasms. 02/16/16   Adam Phenix, PA-C  modafinil (PROVIGIL) 200 MG tablet Take 2 tablets (400 mg total) by mouth daily. 02/23/16   Sherrie George, PA-C  Multiple Vitamin (MULTIVITAMIN WITH MINERALS) TABS tablet You can get these at any drug store, take 1 daily after lunch. 02/22/16   Sherrie George, PA-C  omeprazole (PRILOSEC) 40 MG capsule Take 40 mg by mouth daily. 11/09/15   [provider]  oxyCODONE (OXY IR/ROXICODONE) 5 MG immediate release tablet Take 1 tablet (5 mg total) by mouth every 4 (four) hours as needed for moderate pain. 02/22/16   Sherrie George, PA-C  Polyethyl Glycol-Propyl Glycol (SYSTANE OP) Place 1 drop into both eyes 3 (three) times daily as needed (dry eyes/ irritation).     [provider]  rOPINIRole (REQUIP) 2 MG tablet Take 2 mg by mouth See admin instructions. Take 3 tablets (6 mg) by mouth 2 hours before bedtime 12/03/15   [provider]  senna (SENOKOT) 8.6 MG TABS tablet Take 1 tablet (8.6 mg total) by mouth daily. 02/16/16   Adam Phenix, PA-C    Family History No family history on file.  Social History Social History  Substance Use Topics  . Smoking status: Never Smoker  . Smokeless tobacco: Never Used  . Alcohol use Not on file     Allergies   Gluten meal; Lactose intolerance (gi); Topamax [topiramate]; Wheat bran; Gabapentin; Lactase; Lyrica [pregabalin]; Soy allergy; Tagamet [cimetidine]; Tizanidine; Tylenol [acetaminophen]; and Nsaids   Review of Systems Review of Systems  Unable to perform ROS: Mental status change     Physical Exam Updated  Vital Signs BP (!) 164/65 (BP Location: Right Arm)   Pulse 96   Temp 98.1 F (36.7 C)   Resp 12   Ht 1.6 m (5\' 3" )   Wt 53.1 kg (117 lb)   SpO2 100%   BMI 20.73 kg/m   Physical Exam  Constitutional: She is oriented to person, place, and time. She appears well-developed and well-nourished. She appears lethargic.  Non-toxic appearance. No distress.  HENT:  Head: Normocephalic and atraumatic.  Eyes: Conjunctivae, EOM and lids are normal. Pupils are equal, round, and reactive to light.  Neck: Normal range of motion. Neck supple. No tracheal deviation present. No thyroid mass present.  Cardiovascular: Normal rate, regular rhythm and normal heart sounds.  Exam reveals no gallop.   No murmur heard. Pulmonary/Chest: Effort normal and breath sounds normal. No stridor. No respiratory distress. She has no decreased breath sounds. She has no wheezes. She has no rhonchi. She has no rales.  Abdominal: Soft. Normal appearance and bowel sounds are normal. She exhibits no distension. There is no tenderness. There is no rebound and no CVA tenderness.  Musculoskeletal: Normal range of motion. She exhibits no edema or tenderness.  Neurological: She is oriented to person, place, and time. She has normal strength. She appears lethargic. No cranial nerve deficit or sensory deficit. GCS eye subscore is 4. GCS verbal subscore is 5. GCS motor subscore is 6.  Skin: Skin is warm and dry. No abrasion and no rash noted.  Psychiatric: Her affect is blunt. Her speech is delayed. She is withdrawn.  Nursing note and vitals reviewed.    ED Treatments / Results  Labs (all labs ordered are listed, but only abnormal results are displayed) Labs Reviewed  URINE CULTURE  CBC WITH DIFFERENTIAL/PLATELET  COMPREHENSIVE METABOLIC PANEL  ETHANOL  RAPID URINE DRUG SCREEN, HOSP PERFORMED  URINALYSIS, ROUTINE W REFLEX MICROSCOPIC  CK  TSH  CBG MONITORING, ED    EKG  EKG Interpretation  Date/Time:  Monday July 25 2016  15:55:16 EDT Ventricular Rate:  90 PR Interval:    QRS Duration: 99 QT Interval:  386 QTC Calculation: 473 R Axis:   41 Text Interpretation:  Sinus rhythm Biatrial enlargement RSR' in V1 or V2, probably normal variant Left ventricular hypertrophy Nonspecific T abnrm, anterolateral leads Baseline wander in lead(s) V5 Confirmed by Linwood DibblesKnapp, Jon [phone]([id number]) on 07/25/2016 3:57:35 PM       Radiology No results found.  Procedures Procedures (including critical care time)  Medications Ordered in ED Medications  sodium chloride 0.9 % bolus 1,000 mL (not administered)  0.9 %  sodium chloride infusion (not administered)     Initial Impression / Assessment and Plan / ED Course  I have reviewed the triage vital signs and the nursing notes.  Pertinent labs & imaging results that were available during my care of the patient were reviewed by me and considered in my medical decision making (see chart for details).     Patient's workup here without clear etiology of her symptoms. Had mild hypokalemia that was treated with potassium. I suspect that this could be from patient's medications. She is sleepy but arousable. Will consult internal medicine for admission for observation.  Final Clinical Impressions(s) / ED Diagnoses   Final diagnoses:  None    New Prescriptions New Prescriptions   No medications on file     Lorre NickAllen, Jawanza Zambito, MD 07/25/16 2028

## 2016-07-25 NOTE — ED Notes (Signed)
Phlebotomy at bedside.

## 2016-07-25 NOTE — ED Triage Notes (Signed)
Pt arrives via EMS with reports of heat exposure. Per EMS pt was at the store and was in her car with the heat turned on. EMS states the pt was diaphoretic and hot to the touch on their arrival. Pt A&OX4 on arrival, however can not give details of events that led her to be brought to the hospital. With EMS BP 150/90, HR 90's, CBG 84, 96% on Room Air. Pt with a fentanyl patch to R thigh. Pt did have 1 episode of emesis.

## 2016-07-25 NOTE — ED Notes (Signed)
Attempted report X1

## 2016-07-26 ENCOUNTER — Observation Stay (HOSPITAL_COMMUNITY): Payer: Medicare Other

## 2016-07-26 DIAGNOSIS — Z933 Colostomy status: Secondary | ICD-10-CM

## 2016-07-26 DIAGNOSIS — D509 Iron deficiency anemia, unspecified: Secondary | ICD-10-CM | POA: Diagnosis not present

## 2016-07-26 DIAGNOSIS — K5900 Constipation, unspecified: Secondary | ICD-10-CM

## 2016-07-26 DIAGNOSIS — G894 Chronic pain syndrome: Secondary | ICD-10-CM | POA: Diagnosis not present

## 2016-07-26 DIAGNOSIS — G9340 Encephalopathy, unspecified: Secondary | ICD-10-CM | POA: Diagnosis not present

## 2016-07-26 LAB — BASIC METABOLIC PANEL
Anion gap: 7 (ref 5–15)
BUN: 15 mg/dL (ref 6–20)
CALCIUM: 8.6 mg/dL — AB (ref 8.9–10.3)
CO2: 29 mmol/L (ref 22–32)
CREATININE: 0.41 mg/dL — AB (ref 0.44–1.00)
Chloride: 102 mmol/L (ref 101–111)
GFR calc Af Amer: >60 mL/min (ref >60)
GFR calc non Af Amer: >60 mL/min (ref >60)
GLUCOSE: 85 mg/dL (ref 65–99)
Potassium: 3.3 mmol/L — ABNORMAL LOW (ref 3.5–5.1)
Sodium: 138 mmol/L (ref 135–145)

## 2016-07-26 LAB — IRON AND TIBC
Iron: 26 ug/dL — ABNORMAL LOW (ref 28–170)
Saturation Ratios: 5 % — ABNORMAL LOW (ref 10.4–31.8)
TIBC: 526 ug/dL — AB (ref 250–450)
UIBC: 500 ug/dL

## 2016-07-26 LAB — CBC
HCT: 24.9 % — ABNORMAL LOW (ref 36.0–46.0)
Hemoglobin: 7.2 g/dL — ABNORMAL LOW (ref 12.0–15.0)
MCH: 20.6 pg — AB (ref 26.0–34.0)
MCHC: 28.9 g/dL — AB (ref 30.0–36.0)
MCV: 71.3 fL — ABNORMAL LOW (ref 78.0–100.0)
PLATELETS: 330 10*3/uL (ref 150–400)
RBC: 3.49 MIL/uL — ABNORMAL LOW (ref 3.87–5.11)
RDW: 18.8 % — ABNORMAL HIGH (ref 11.5–15.5)
WBC: 4 10*3/uL (ref 4.0–10.5)

## 2016-07-26 LAB — RETICULOCYTES
RBC.: 3.77 MIL/uL — AB (ref 3.87–5.11)
RETIC CT PCT: 1.8 % (ref 0.4–3.1)
Retic Count, Absolute: 67.9 10*3/uL (ref 19.0–186.0)

## 2016-07-26 LAB — GLUCOSE, CAPILLARY
GLUCOSE-CAPILLARY: 84 mg/dL (ref 65–99)
GLUCOSE-CAPILLARY: 108 mg/dL — AB (ref 65–99)
GLUCOSE-CAPILLARY: 96 mg/dL (ref 65–99)
Glucose-Capillary: 86 mg/dL (ref 65–99)

## 2016-07-26 LAB — URINE CULTURE: Culture: NO GROWTH

## 2016-07-26 LAB — FERRITIN: Ferritin: 5 ng/mL — ABNORMAL LOW (ref 11–307)

## 2016-07-26 LAB — VITAMIN B12: VITAMIN B 12: 998 pg/mL — AB (ref 180–914)

## 2016-07-26 LAB — FOLATE: Folate: 29.2 ng/mL (ref >5.9)

## 2016-07-26 MED ORDER — POTASSIUM CHLORIDE CRYS ER 20 MEQ PO TBCR
40.0000 meq | EXTENDED_RELEASE_TABLET | Freq: Once | ORAL | Status: AC
  Administered 2016-07-26: 40 meq via ORAL
  Filled 2016-07-26: qty 2

## 2016-07-26 MED ORDER — SENNA 8.6 MG PO TABS
1.0000 | ORAL_TABLET | Freq: Every day | ORAL | Status: DC
  Administered 2016-07-26: 8.6 mg via ORAL
  Filled 2016-07-26: qty 1

## 2016-07-26 MED ORDER — FENTANYL CITRATE (PF) 100 MCG/2ML IJ SOLN
12.5000 ug | Freq: Once | INTRAMUSCULAR | Status: AC
  Administered 2016-07-26: 12.5 ug via INTRAVENOUS
  Filled 2016-07-26: qty 2

## 2016-07-26 MED ORDER — DOCUSATE SODIUM 100 MG PO CAPS
100.0000 mg | ORAL_CAPSULE | Freq: Two times a day (BID) | ORAL | Status: DC
  Administered 2016-07-26 – 2016-07-27 (×2): 100 mg via ORAL
  Filled 2016-07-26 (×3): qty 1

## 2016-07-26 MED ORDER — LEVOTHYROXINE SODIUM 100 MCG IV SOLR: 50.0000 ug | Freq: Every day | INTRAVENOUS | Status: DC

## 2016-07-26 MED ORDER — LEVOTHYROXINE SODIUM 100 MCG PO TABS
100.0000 ug | ORAL_TABLET | Freq: Every day | ORAL | Status: DC
  Administered 2016-07-26 – 2016-07-27 (×2): 100 ug via ORAL
  Filled 2016-07-26 (×2): qty 1

## 2016-07-26 MED ORDER — POLYETHYLENE GLYCOL 3350 17 G PO PACK
17.0000 g | PACK | Freq: Two times a day (BID) | ORAL | Status: DC
  Administered 2016-07-26 – 2016-07-27 (×2): 17 g via ORAL
  Filled 2016-07-26 (×3): qty 1

## 2016-07-26 NOTE — Care Management Obs Status (Signed)
MEDICARE OBSERVATION STATUS NOTIFICATION   Patient Details  Name: Wendy Espinoza MRN: [id number] Date of Birth: 12/02/1940   Medicare Observation Status Notification Given:  Yes    Epifanio LeschesCole, Stacey Sago Hudson, RN 07/26/2016, 3:44 PM
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
## 2016-07-26 NOTE — Progress Notes (Signed)
Pt. lost her IV access and wants to know if we can leave it out, also can her diet be advanced.  Text paged MD to ask.  Will continue to follow ans await for new orders or return call.  Forbes Cellarelcine Ketty Bitton, RN

## 2016-07-27 ENCOUNTER — Encounter (HOSPITAL_COMMUNITY): Payer: Self-pay | Admitting: General Practice

## 2016-07-27 DIAGNOSIS — G894 Chronic pain syndrome: Secondary | ICD-10-CM

## 2016-07-27 DIAGNOSIS — G9340 Encephalopathy, unspecified: Secondary | ICD-10-CM

## 2016-07-27 DIAGNOSIS — D638 Anemia in other chronic diseases classified elsewhere: Secondary | ICD-10-CM

## 2016-07-27 LAB — CBC
HCT: 29.7 % — ABNORMAL LOW (ref 36.0–46.0)
Hemoglobin: 8.6 g/dL — ABNORMAL LOW (ref 12.0–15.0)
MCH: 20.7 pg — ABNORMAL LOW (ref 26.0–34.0)
MCHC: 29 g/dL — AB (ref 30.0–36.0)
MCV: 71.4 fL — ABNORMAL LOW (ref 78.0–100.0)
PLATELETS: 371 10*3/uL (ref 150–400)
RBC: 4.16 MIL/uL (ref 3.87–5.11)
RDW: 19.1 % — AB (ref 11.5–15.5)
WBC: 3.6 10*3/uL — AB (ref 4.0–10.5)

## 2016-07-27 LAB — BASIC METABOLIC PANEL
ANION GAP: 7 (ref 5–15)
BUN: 8 mg/dL (ref 6–20)
CALCIUM: 8.9 mg/dL (ref 8.9–10.3)
CO2: 26 mmol/L (ref 22–32)
Chloride: 103 mmol/L (ref 101–111)
Creatinine, Ser: 0.46 mg/dL (ref 0.44–1.00)
Glucose, Bld: 79 mg/dL (ref 65–99)
Potassium: 3.3 mmol/L — ABNORMAL LOW (ref 3.5–5.1)
SODIUM: 136 mmol/L (ref 135–145)

## 2016-07-27 LAB — GLUCOSE, CAPILLARY: GLUCOSE-CAPILLARY: 81 mg/dL (ref 65–99)

## 2016-07-27 LAB — MAGNESIUM: MAGNESIUM: 2 mg/dL (ref 1.7–2.4)

## 2016-07-27 MED ORDER — DOCUSATE SODIUM 100 MG PO CAPS: 100.0000 mg | ORAL_CAPSULE | Freq: Two times a day (BID) | ORAL | 0 refills | Status: AC | PRN

## 2016-07-27 MED ORDER — TRAMADOL HCL 50 MG PO TABS
50.0000 mg | ORAL_TABLET | Freq: Once | ORAL | Status: AC
  Administered 2016-07-27: 50 mg via ORAL
  Filled 2016-07-27: qty 1
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
## 2016-07-27 NOTE — Progress Notes (Signed)
CSW spoke with patient at bedside concerning PT recommendation for SNF.  [facility] but she has no interest in returning to a SNF at this time.  Patient lives alone but feels as if she can manage safely she is working on getting things ready to move to [place] with her son at the end of July.  CSW informed RNCM of patient request to go home with home services  CSW signing off  Burna SisJenna H. Shawnta Schlegel, LCSW Clinical Social Worker [phone]

## 2016-07-27 NOTE — Care Management Note (Addendum)
Case Management Note  Patient Details  Name: Wendy Espinoza MRN: [id number] Date of Birth: 09/29/1940  Subjective/Objective:              Acute encephalopathy, possibly medication related.  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 Pt refusing SNF placement. Pt understands the need for 24/7 supervision /assistance. States she will be fine @ home caring for self. Pt will d/c with home health services (PT, Nurse Aide).  Pt's sister in law will provide transportation to home.  Expected Discharge Date:  07/27/16               Expected Discharge Plan:  Home w Home Health Services  In-House Referral:     Discharge planning Services  CM Consult  Post Acute Care Choice:    Choice offered to:  Patient  DME Arranged:    DME Agency:     HH Arranged:  PT, Nurse's Aide, referral made with [facility] HH Agency:  [facility]  Status of Service:  Completed, signed off  If discussed at Long Length of Stay Meetings, dates discussed:    Additional Comments:  Epifanio LeschesCole, [name] Hudson, RN 07/27/2016, 11:16 AM

## 2016-08-08 ENCOUNTER — Inpatient Hospital Stay (HOSPITAL_COMMUNITY): Admission: RE | Admit: 2016-08-08 | Payer: Medicare Other | Source: Ambulatory Visit

## 2016-08-15 ENCOUNTER — Inpatient Hospital Stay: Admit: 2016-08-15 | Payer: Medicare Other | Admitting: General Surgery

## 2016-08-15 SURGERY — COLOSTOMY REVERSAL
Anesthesia: General

## 2018-01-11 IMAGING — RF DG BE THRU COLOSTOMY
5 series · 14 of 24 positions shown · non-contrast
Comparison: CT 02/18/2016.

CLINICAL DATA: Colostomy status

EXAM:
SINGLE COLUMN BARIUM ENEMA
TECHNIQUE: Initial scout AP supine abdominal image obtained to insure adequate
colon cleansing. Barium was introduced into the colon in a
retrograde fashion and refluxed from the rectum to the cecum. Spot
images of the colon followed by overhead radiographs were obtained.
FLUOROSCOPY TIME:  Fluoroscopy Time:  2 minutes 18 seconds
Radiation Exposure Index (if provided by the fluoroscopic device):
239 mGy
Number of Acquired Spot Images: 0

[Series 1: one shot · 1 of 1 slices shown (1 of 3)]
[im 1/1]
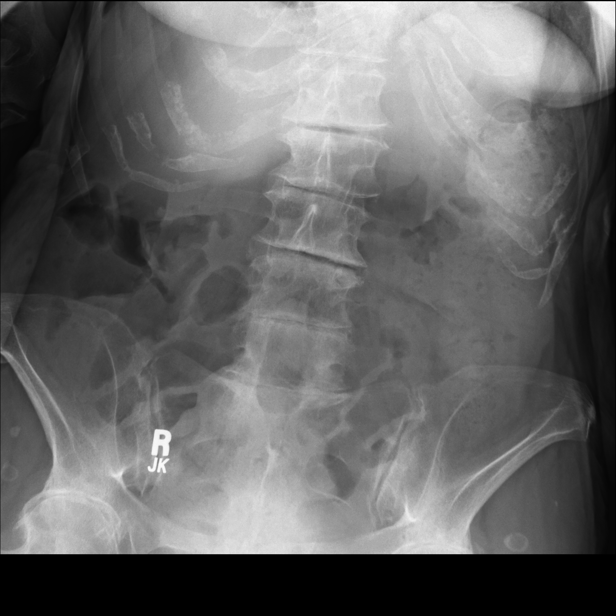

[Series 2: sequence · 2 of 45 frames shown (1 of 2)]
[frame 23/45]
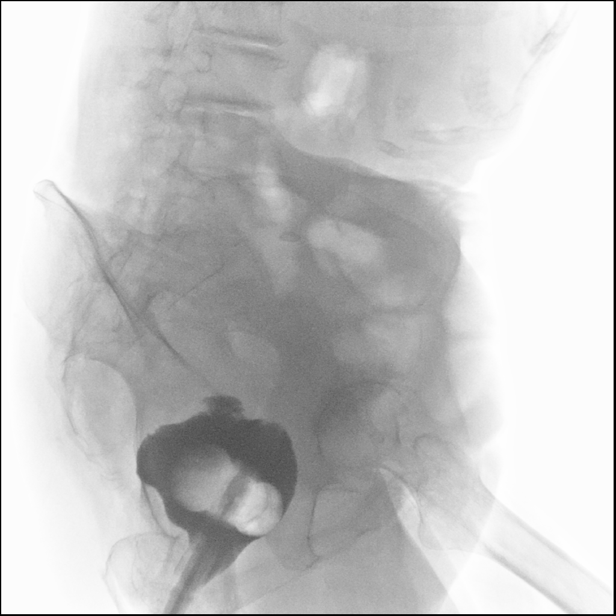
[frame 42/45]
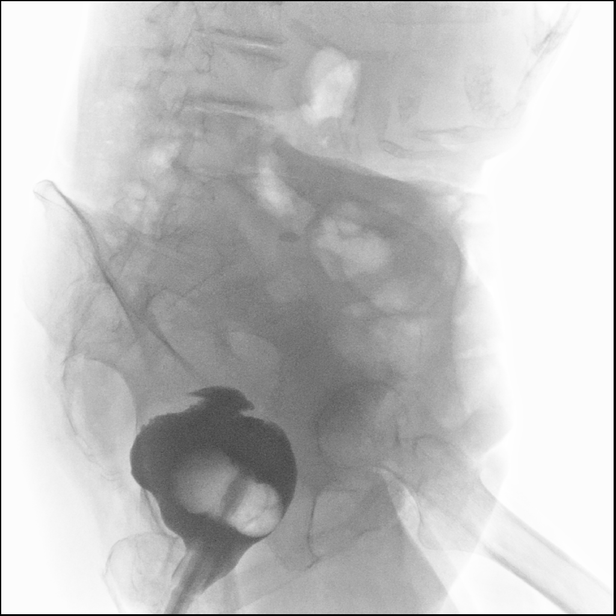

[Series 3: one shot · 9 of 15 slices shown (2 of 3)]
[im 2/15]
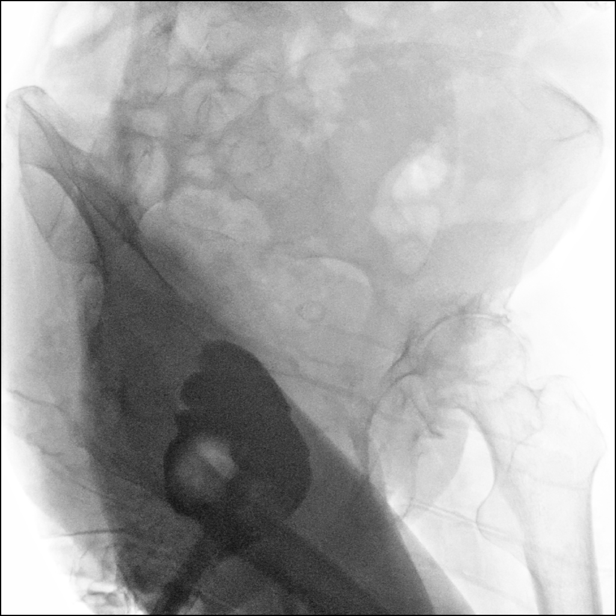
[im 3/15]
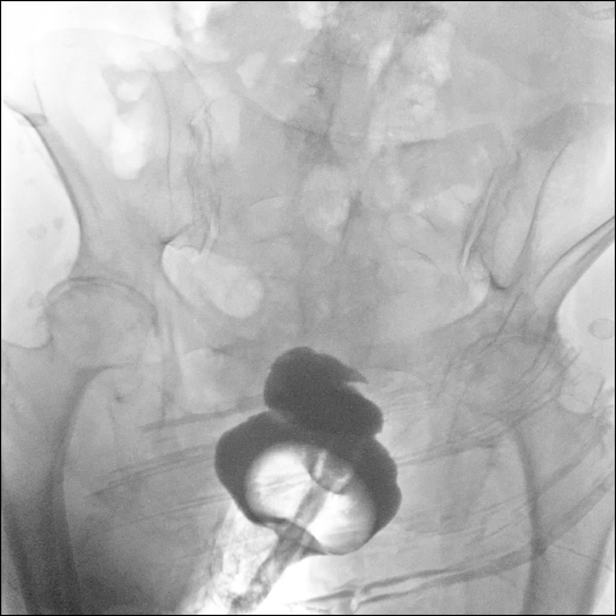
[im 5/15]
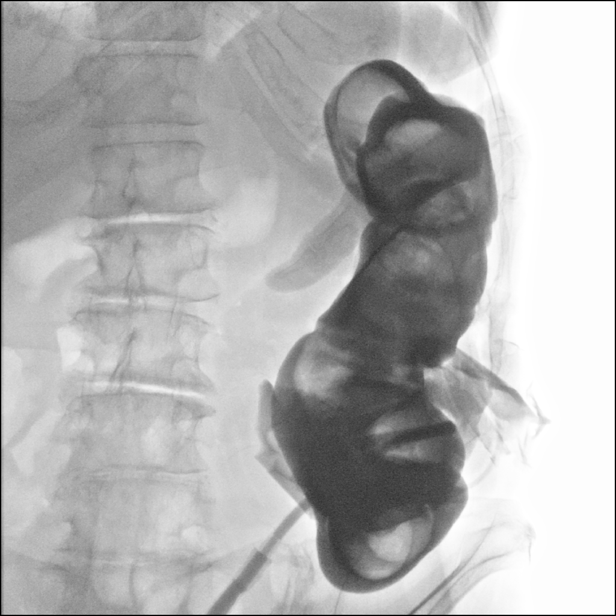
[im 7/15]
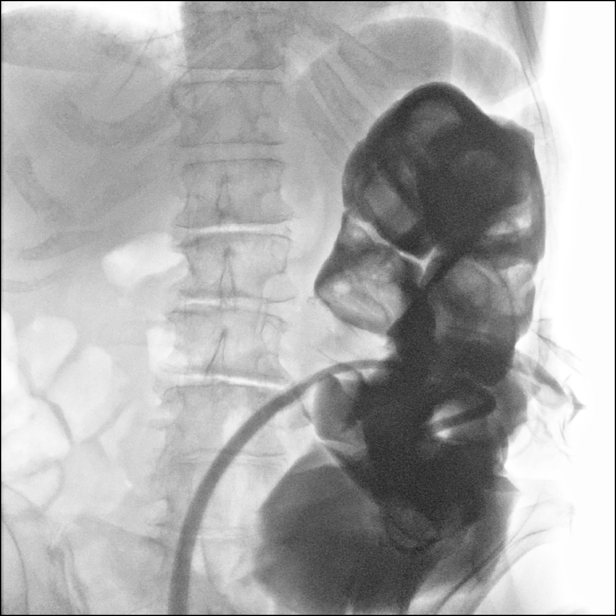
[im 8/15]
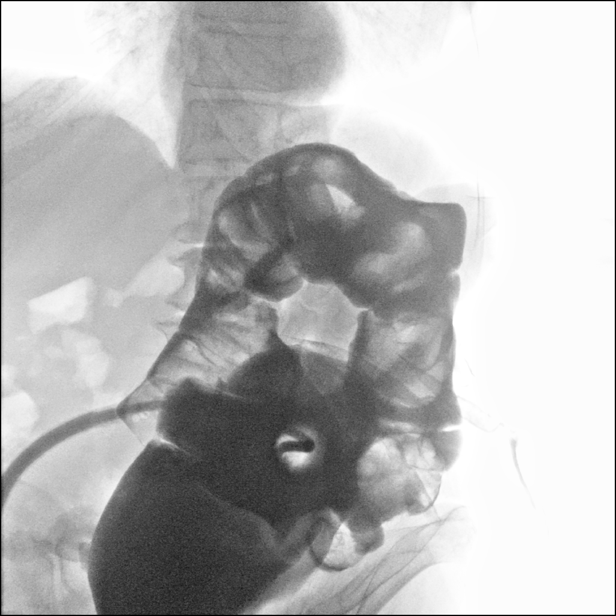
[im 10/15]
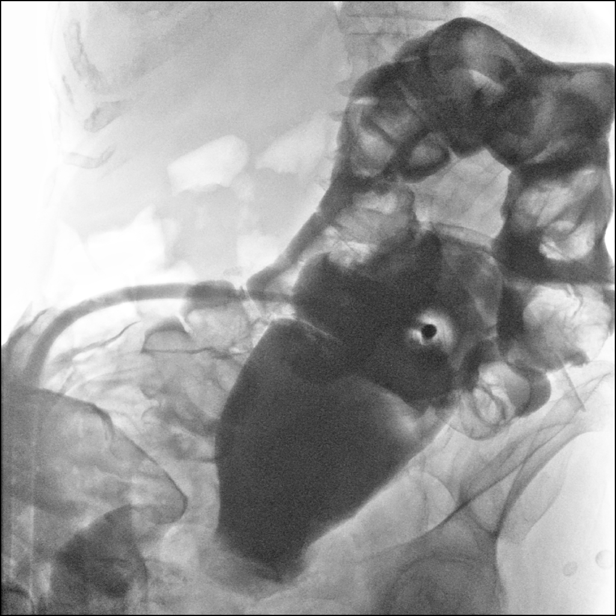
[im 12/15]
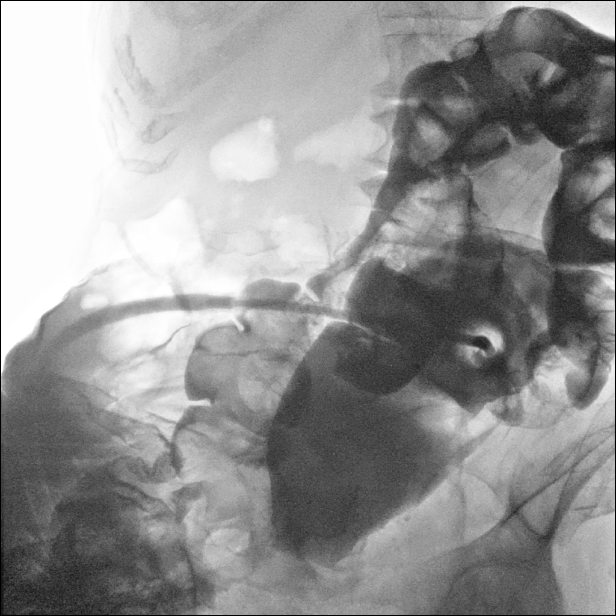
[im 14/15]
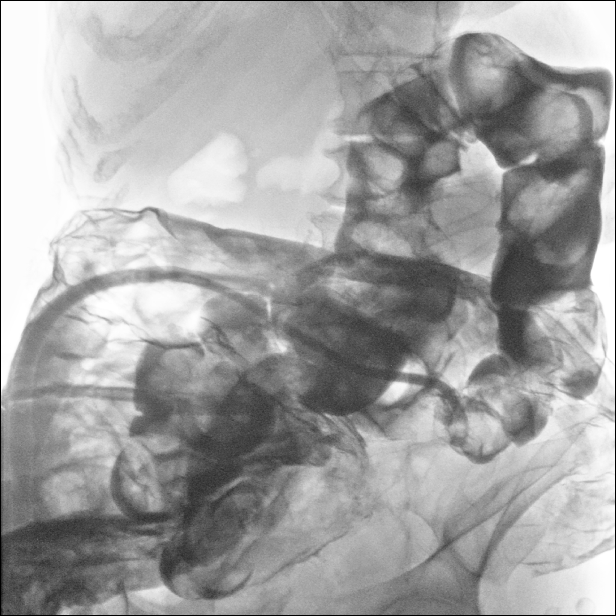
[im 15/15]
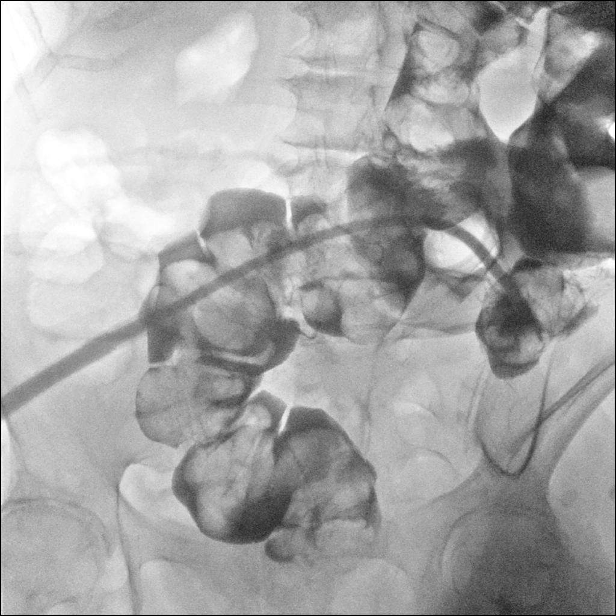

[Series 4: sequence · 1 of 23 frames shown (2 of 2)]
[frame 12/23]
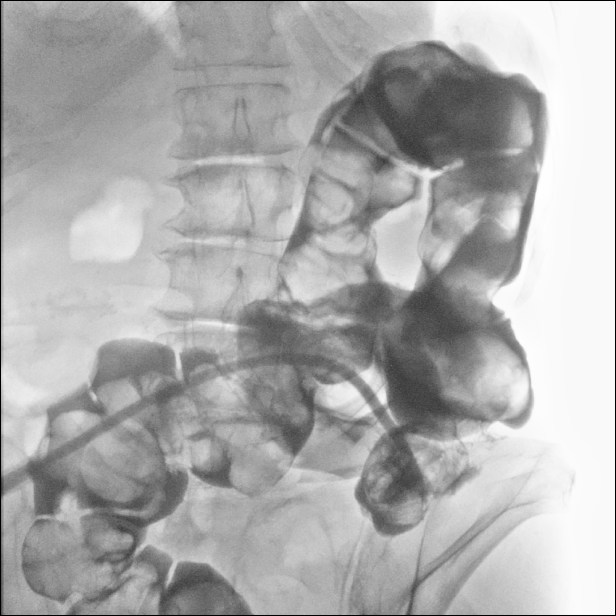

[Series 5: one shot · 1 of 1 slices shown (3 of 3)]
[im 1/1]
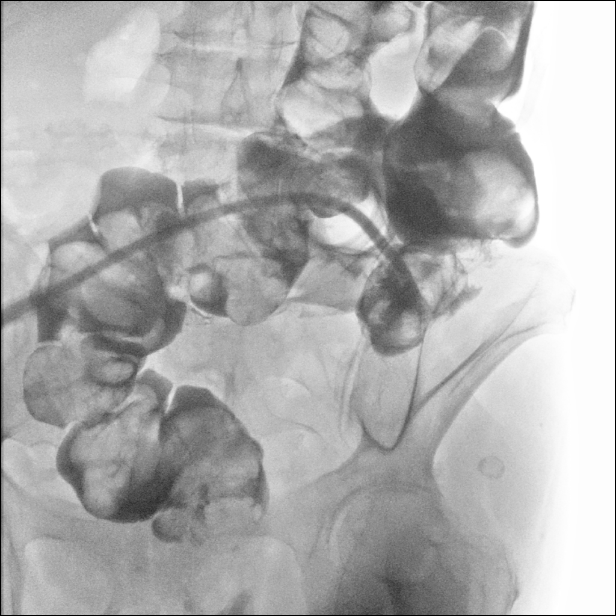

[14 of 24 positions shown; findings below may reference images not displayed]

FINDINGS: Initially, imaging was performed to of the rectum. There is a small
rectal pouch which is grossly unremarkable.

Subsequently, contrast was used to fill the remainder the colon
through the ostomy. There is a large amount of retained stool in the
colon. No fixed strictures for other obvious significant
abnormality. No visible diverticula.
IMPRESSION: Short-segment rectum noted and grossly unremarkable. Grossly
unremarkable remainder the colon through the ostomy with large
amount of retained stool.
